# Patient Record
Sex: Male | Born: 1935 | Race: White | Hispanic: No | Marital: Married | Smoking: Never smoker
Health system: Southern US, Community
[De-identification: ages and names within clinical notes are randomized; demographics above are authoritative.]

## PROBLEM LIST (undated history)

## (undated) DIAGNOSIS — N200 Calculus of kidney: Secondary | ICD-10-CM

## (undated) DIAGNOSIS — E7800 Pure hypercholesterolemia, unspecified: Secondary | ICD-10-CM

## (undated) DIAGNOSIS — I10 Essential (primary) hypertension: Secondary | ICD-10-CM

---

## 1998-04-25 ENCOUNTER — Other Ambulatory Visit: Admission: RE | Admit: 1998-04-25 | Discharge: 1998-04-25 | Payer: Self-pay | Admitting: Urology

## 1998-08-21 ENCOUNTER — Ambulatory Visit (HOSPITAL_COMMUNITY): Admission: RE | Admit: 1998-08-21 | Discharge: 1998-08-21 | Payer: Self-pay | Admitting: Gastroenterology

## 1998-08-21 ENCOUNTER — Encounter (INDEPENDENT_AMBULATORY_CARE_PROVIDER_SITE_OTHER): Payer: Self-pay

## 1999-03-20 ENCOUNTER — Encounter: Admission: RE | Admit: 1999-03-20 | Discharge: 1999-03-20 | Payer: Self-pay | Admitting: Family Medicine

## 1999-03-20 ENCOUNTER — Encounter: Payer: Self-pay | Admitting: Family Medicine

## 2000-03-19 ENCOUNTER — Encounter: Payer: Self-pay | Admitting: Family Medicine

## 2000-03-19 ENCOUNTER — Encounter: Admission: RE | Admit: 2000-03-19 | Discharge: 2000-03-19 | Payer: Self-pay | Admitting: Family Medicine

## 2000-08-11 ENCOUNTER — Encounter: Admission: RE | Admit: 2000-08-11 | Discharge: 2000-08-11 | Payer: Self-pay | Admitting: Family Medicine

## 2000-08-11 ENCOUNTER — Encounter: Payer: Self-pay | Admitting: Family Medicine

## 2000-09-16 ENCOUNTER — Encounter: Payer: Self-pay | Admitting: Family Medicine

## 2000-09-16 ENCOUNTER — Encounter: Admission: RE | Admit: 2000-09-16 | Discharge: 2000-09-16 | Payer: Self-pay | Admitting: Family Medicine

## 2001-08-17 ENCOUNTER — Ambulatory Visit (HOSPITAL_COMMUNITY): Admission: RE | Admit: 2001-08-17 | Discharge: 2001-08-17 | Payer: Self-pay | Admitting: Gastroenterology

## 2004-04-06 ENCOUNTER — Ambulatory Visit: Payer: Self-pay | Admitting: Internal Medicine

## 2004-09-20 ENCOUNTER — Ambulatory Visit: Payer: Self-pay | Admitting: Internal Medicine

## 2004-09-27 ENCOUNTER — Ambulatory Visit (HOSPITAL_COMMUNITY): Admission: RE | Admit: 2004-09-27 | Discharge: 2004-09-27 | Payer: Self-pay | Admitting: Urology

## 2004-11-17 ENCOUNTER — Emergency Department (HOSPITAL_COMMUNITY): Admission: EM | Admit: 2004-11-17 | Discharge: 2004-11-17 | Payer: Self-pay | Admitting: Emergency Medicine

## 2004-12-08 ENCOUNTER — Emergency Department (HOSPITAL_COMMUNITY): Admission: EM | Admit: 2004-12-08 | Discharge: 2004-12-08 | Payer: Self-pay | Admitting: Emergency Medicine

## 2005-02-18 ENCOUNTER — Ambulatory Visit: Payer: Self-pay | Admitting: Internal Medicine

## 2008-03-14 ENCOUNTER — Encounter: Admission: RE | Admit: 2008-03-14 | Discharge: 2008-03-14 | Payer: Self-pay | Admitting: Family Medicine

## 2010-03-25 ENCOUNTER — Encounter: Payer: Self-pay | Admitting: Family Medicine

## 2010-07-20 NOTE — Consult Note (Signed)
NAME:  JOE, GEE NO.:  [id number]   MEDICAL RECORD NO.:  [id number]          PATIENT TYPE:  EMS   LOCATION:  ED                           FACILITY:  [facility]   PHYSICIAN:  Claudette Laws, M.D.  DATE OF BIRTH:  February 20, 1933   DATE OF CONSULTATION:  11/17/2004  DATE OF DISCHARGE:                                   CONSULTATION   CHIEF COMPLAINT:  Cannot urinate.   HISTORY OF PRESENT ILLNESS:  This 77 year old, married white male, a patient  of Dr. Boston Service, called me earlier this afternoon with the history  of urinary retention and suprapubic pain.  By history he does have a history  of an enlarged prostate and also a variable elevated PSA, status post  prostate biopsies.  The patient had a lithotripsy upon a renal stone back in  July.  He was playing golf today, came home and developed pain.  He came  down here from [facility] I saw him in the emergency room.   He was in acute distress with distended bladder, a circumcised male, normal  penis and scrotum, normal testicles.  We right ahead and injected 10 mL of  Xylocaine jelly and then put in a 16 French 10-cc Foley catheter and  immediately drained out about 700 mL of grossly clear urine, which we  submitted to the lab for a UA and a culture.  I went over the findings with  the patient, and we will send him home with a catheter to a leg drainage  bag, and then he will follow up with Dr. Wanda Plump the first part of next  week.  I put the patient on Flomax 0.4 mg, 1 a day number 30, and also I  started him on Cipro 250 mg 1 b.i.d. number 10 pending the urine culture.      Claudette Laws, M.D.  Electronically Signed     RFS/MEDQ  D:  11/17/2004  T:  11/17/2004  Job:  [id number]

## 2010-07-20 NOTE — Procedures (Signed)
[facility]  Patient:    TABB, CROGHAN Visit Number: [id number] MRN: [id number]          Service Type: END Location: ENDO Attending Physician:  Louie Bun Dictated by:   Everardo All Madilyn Fireman, M.D. Proc. Date: 08/17/01 Admit Date:  08/17/2001   CC:         Desma Maxim, M.D.   Procedure Report  PROCEDURE:  Colonoscopy.  INDICATION FOR PROCEDURE:  Adenomatous colon polyp three years ago.  DESCRIPTION OF PROCEDURE:  The patient was placed in the left lateral decubitus position and placed on the pulse monitor with continuous low-flow oxygen delivered by nasal cannula.  He was sedated with 50 mg of IV Demerol and 7 mg of IV Versed.  The Olympus video colonoscope was inserted into the rectum and advanced to the cecum, confirmed by transillumination at McBurneys point and visualization of the ileocecal valve and appendiceal orifice.  The prep was excellent.  The cecum, ascending, transverse, descending and sigmoid colon all appeared normal with no masses, polyps, diverticula, or other mucosal abnormalities.  The rectum likewise appeared normal, and retroflex view of the anus revealed only small internal hemorrhoids.  The colonoscope was then withdrawn, and the patient returned to the recovery room in stable condition.  He tolerated the procedure well, and there were no immediate complications.  IMPRESSION:  Normal colonoscopy.  PLAN:  Repeat colonoscopy in five years based on previous history of polyps. Dictated by:   Everardo All Madilyn Fireman, M.D. Attending Physician:  Louie Bun DD:  08/17/01 TD:  08/17/01 Job: [id number]

## 2015-04-12 DIAGNOSIS — H268 Other specified cataract: Secondary | ICD-10-CM | POA: Diagnosis not present

## 2015-04-12 DIAGNOSIS — H40002 Preglaucoma, unspecified, left eye: Secondary | ICD-10-CM | POA: Diagnosis not present

## 2015-04-12 DIAGNOSIS — H25812 Combined forms of age-related cataract, left eye: Secondary | ICD-10-CM | POA: Diagnosis not present

## 2015-04-12 DIAGNOSIS — Z961 Presence of intraocular lens: Secondary | ICD-10-CM | POA: Diagnosis not present

## 2015-06-01 DIAGNOSIS — N401 Enlarged prostate with lower urinary tract symptoms: Secondary | ICD-10-CM | POA: Diagnosis not present

## 2015-06-08 DIAGNOSIS — N401 Enlarged prostate with lower urinary tract symptoms: Secondary | ICD-10-CM | POA: Diagnosis not present

## 2015-06-08 DIAGNOSIS — N138 Other obstructive and reflux uropathy: Secondary | ICD-10-CM | POA: Diagnosis not present

## 2015-06-08 DIAGNOSIS — Z87442 Personal history of urinary calculi: Secondary | ICD-10-CM | POA: Diagnosis not present

## 2015-06-08 DIAGNOSIS — Z0000 Encounter for general adult medical examination without abnormal findings: Secondary | ICD-10-CM | POA: Diagnosis not present

## 2015-06-08 DIAGNOSIS — R351 Nocturia: Secondary | ICD-10-CM | POA: Diagnosis not present

## 2015-06-08 DIAGNOSIS — R972 Elevated prostate specific antigen [PSA]: Secondary | ICD-10-CM | POA: Diagnosis not present

## 2015-06-30 DIAGNOSIS — D1801 Hemangioma of skin and subcutaneous tissue: Secondary | ICD-10-CM | POA: Diagnosis not present

## 2015-06-30 DIAGNOSIS — L918 Other hypertrophic disorders of the skin: Secondary | ICD-10-CM | POA: Diagnosis not present

## 2015-06-30 DIAGNOSIS — D224 Melanocytic nevi of scalp and neck: Secondary | ICD-10-CM | POA: Diagnosis not present

## 2015-06-30 DIAGNOSIS — L814 Other melanin hyperpigmentation: Secondary | ICD-10-CM | POA: Diagnosis not present

## 2015-06-30 DIAGNOSIS — D485 Neoplasm of uncertain behavior of skin: Secondary | ICD-10-CM | POA: Diagnosis not present

## 2015-06-30 DIAGNOSIS — D225 Melanocytic nevi of trunk: Secondary | ICD-10-CM | POA: Diagnosis not present

## 2015-06-30 DIAGNOSIS — Z85828 Personal history of other malignant neoplasm of skin: Secondary | ICD-10-CM | POA: Diagnosis not present

## 2015-06-30 DIAGNOSIS — L821 Other seborrheic keratosis: Secondary | ICD-10-CM | POA: Diagnosis not present

## 2015-06-30 DIAGNOSIS — L570 Actinic keratosis: Secondary | ICD-10-CM | POA: Diagnosis not present

## 2015-07-07 DIAGNOSIS — R7303 Prediabetes: Secondary | ICD-10-CM | POA: Diagnosis not present

## 2015-07-07 DIAGNOSIS — Z6831 Body mass index (BMI) 31.0-31.9, adult: Secondary | ICD-10-CM | POA: Diagnosis not present

## 2015-07-07 DIAGNOSIS — N183 Chronic kidney disease, stage 3 (moderate): Secondary | ICD-10-CM | POA: Diagnosis not present

## 2015-07-07 DIAGNOSIS — E782 Mixed hyperlipidemia: Secondary | ICD-10-CM | POA: Diagnosis not present

## 2015-07-07 DIAGNOSIS — I129 Hypertensive chronic kidney disease with stage 1 through stage 4 chronic kidney disease, or unspecified chronic kidney disease: Secondary | ICD-10-CM | POA: Diagnosis not present

## 2015-07-07 DIAGNOSIS — E669 Obesity, unspecified: Secondary | ICD-10-CM | POA: Diagnosis not present

## 2015-07-07 DIAGNOSIS — R7309 Other abnormal glucose: Secondary | ICD-10-CM | POA: Diagnosis not present

## 2015-07-27 DIAGNOSIS — Z85828 Personal history of other malignant neoplasm of skin: Secondary | ICD-10-CM | POA: Diagnosis not present

## 2015-07-27 DIAGNOSIS — L570 Actinic keratosis: Secondary | ICD-10-CM | POA: Diagnosis not present

## 2015-08-22 ENCOUNTER — Encounter (HOSPITAL_COMMUNITY): Payer: Self-pay | Admitting: Emergency Medicine

## 2015-08-22 ENCOUNTER — Observation Stay (HOSPITAL_COMMUNITY)
Admission: EM | Admit: 2015-08-22 | Discharge: 2015-08-23 | Disposition: A | Discharge status: Home / Self Care | Payer: Medicare HMO | Attending: Family Medicine | Admitting: Family Medicine

## 2015-08-22 ENCOUNTER — Ambulatory Visit
Admission: RE | Admit: 2015-08-22 | Discharge: 2015-08-22 | Disposition: A | Discharge status: Home / Self Care | Payer: Medicare HMO | Source: Ambulatory Visit | Attending: Family Medicine | Admitting: Family Medicine

## 2015-08-22 ENCOUNTER — Other Ambulatory Visit: Payer: Self-pay | Admitting: Family Medicine

## 2015-08-22 DIAGNOSIS — E785 Hyperlipidemia, unspecified: Secondary | ICD-10-CM | POA: Diagnosis not present

## 2015-08-22 DIAGNOSIS — I129 Hypertensive chronic kidney disease with stage 1 through stage 4 chronic kidney disease, or unspecified chronic kidney disease: Secondary | ICD-10-CM | POA: Insufficient documentation

## 2015-08-22 DIAGNOSIS — K862 Cyst of pancreas: Secondary | ICD-10-CM | POA: Diagnosis not present

## 2015-08-22 DIAGNOSIS — D649 Anemia, unspecified: Secondary | ICD-10-CM | POA: Diagnosis not present

## 2015-08-22 DIAGNOSIS — K8689 Other specified diseases of pancreas: Secondary | ICD-10-CM | POA: Diagnosis present

## 2015-08-22 DIAGNOSIS — E7800 Pure hypercholesterolemia, unspecified: Secondary | ICD-10-CM | POA: Diagnosis not present

## 2015-08-22 DIAGNOSIS — N400 Enlarged prostate without lower urinary tract symptoms: Secondary | ICD-10-CM | POA: Insufficient documentation

## 2015-08-22 DIAGNOSIS — I10 Essential (primary) hypertension: Secondary | ICD-10-CM | POA: Diagnosis present

## 2015-08-22 DIAGNOSIS — R0602 Shortness of breath: Secondary | ICD-10-CM

## 2015-08-22 DIAGNOSIS — N183 Chronic kidney disease, stage 3 unspecified: Secondary | ICD-10-CM | POA: Diagnosis present

## 2015-08-22 DIAGNOSIS — Z7982 Long term (current) use of aspirin: Secondary | ICD-10-CM | POA: Insufficient documentation

## 2015-08-22 DIAGNOSIS — Z79899 Other long term (current) drug therapy: Secondary | ICD-10-CM | POA: Diagnosis not present

## 2015-08-22 DIAGNOSIS — D509 Iron deficiency anemia, unspecified: Secondary | ICD-10-CM | POA: Diagnosis present

## 2015-08-22 DIAGNOSIS — R5383 Other fatigue: Secondary | ICD-10-CM | POA: Diagnosis not present

## 2015-08-22 HISTORY — DX: Essential (primary) hypertension: I10

## 2015-08-22 HISTORY — DX: Calculus of kidney: N20.0

## 2015-08-22 HISTORY — DX: Pure hypercholesterolemia, unspecified: E78.00

## 2015-08-22 LAB — COMPREHENSIVE METABOLIC PANEL
ALBUMIN: 3.2 g/dL — AB (ref 3.5–5.0)
ALK PHOS: 44 U/L (ref 38–126)
ALT: 17 U/L (ref 17–63)
ANION GAP: 7 (ref 5–15)
AST: 23 U/L (ref 15–41)
BILIRUBIN TOTAL: 0.4 mg/dL (ref 0.3–1.2)
BUN: 15 mg/dL (ref 6–20)
CALCIUM: 8.8 mg/dL — AB (ref 8.9–10.3)
CO2: 23 mmol/L (ref 22–32)
Chloride: 109 mmol/L (ref 101–111)
Creatinine, Ser: 1.57 mg/dL — ABNORMAL HIGH (ref 0.61–1.24)
GFR calc Af Amer: 46 mL/min — ABNORMAL LOW (ref >60)
GFR calc non Af Amer: 40 mL/min — ABNORMAL LOW (ref >60)
GLUCOSE: 112 mg/dL — AB (ref 65–99)
POTASSIUM: 3.7 mmol/L (ref 3.5–5.1)
SODIUM: 139 mmol/L (ref 135–145)
TOTAL PROTEIN: 6.1 g/dL — AB (ref 6.5–8.1)

## 2015-08-22 LAB — CBC
HEMATOCRIT: 23.3 % — AB (ref 39.0–52.0)
HEMOGLOBIN: 6.4 g/dL — AB (ref 13.0–17.0)
MCH: 20.5 pg — AB (ref 26.0–34.0)
MCHC: 27.5 g/dL — AB (ref 30.0–36.0)
MCV: 74.7 fL — ABNORMAL LOW (ref 78.0–100.0)
Platelets: 182 10*3/uL (ref 150–400)
RBC: 3.12 MIL/uL — ABNORMAL LOW (ref 4.22–5.81)
RDW: 18.7 % — AB (ref 11.5–15.5)
WBC: 6.7 10*3/uL (ref 4.0–10.5)

## 2015-08-22 LAB — POC OCCULT BLOOD, ED: FECAL OCCULT BLD: NEGATIVE

## 2015-08-22 LAB — PREPARE RBC (CROSSMATCH)

## 2015-08-22 LAB — ABO/RH: ABO/RH(D): A NEG

## 2015-08-22 MED ORDER — SODIUM CHLORIDE 0.9 % IV SOLN: Freq: Once | INTRAVENOUS | Status: DC

## 2015-08-22 MED ORDER — IOPAMIDOL (ISOVUE-370) INJECTION 76%
80.0000 mL | Freq: Once | INTRAVENOUS | Status: AC | PRN
  Administered 2015-08-22: 80 mL via INTRAVENOUS

## 2015-08-22 NOTE — ED Notes (Signed)
Attempted to call report x2

## 2015-08-22 NOTE — ED Notes (Signed)
Hemoglobin 6.4 critical lab notification.

## 2015-08-22 NOTE — ED Notes (Signed)
Attempted to call report x 1  

## 2015-08-22 NOTE — ED Provider Notes (Signed)
CSN: [id number]     Arrival date & time 08/22/15  1933 History   First MD Initiated Contact with Patient 08/22/15 2126     Chief Complaint  Patient presents with  . Fatigue  . Abnormal Lab     (Consider location/radiation/quality/duration/timing/severity/associated sxs/prior Treatment) HPI Comments: Patient with history of stage III CKD, non-cancerous polyps identified on colonoscopy in 2010, normal colonoscopy in 2013, prostatic hypertrophy -- presents with approximately one month worth of fatigue and shortness of breath with exertion. Patient was seen by PCP today. CT angiography was done which showed enlargement of a cystic pancreatic mass. This has been previously identified. Hemoglobin was low and patient was referred to the emergency department. Patient is asymptomatic at rest. He denies bleeding in his stool, gums, urine. No abdominal pain or epigastric pain. No history of peptic ulcer disease. Patient denies heavy NSAID use but does take a baby aspirin daily. He does not drink alcohol. The onset of this condition was gradual. The course is constant. Aggravating factors: exertion. Alleviating factors: none.    The history is provided by the patient and a relative.    Past Medical History  Diagnosis Date  . Kidney stones   . Hypertension   . High cholesterol    History reviewed. No pertinent past surgical history. No family history on file. Social History  Substance Use Topics  . Smoking status: Never Smoker   . Smokeless tobacco: None  . Alcohol Use: No    Review of Systems  Constitutional: Positive for fatigue. Negative for fever.  HENT: Negative for rhinorrhea and sore throat.   Eyes: Negative for redness.  Respiratory: Negative for cough.   Cardiovascular: Negative for chest pain.  Gastrointestinal: Negative for nausea, vomiting, abdominal pain and diarrhea.  Genitourinary: Negative for dysuria.  Musculoskeletal: Negative for myalgias.  Skin: Negative for rash.   Neurological: Positive for weakness. Negative for headaches.    Allergies  Review of patient's allergies indicates no known allergies.  Home Medications   Prior to Admission medications   Medication Sig Start Date End Date Taking? Authorizing Provider  aspirin EC 81 MG tablet Take 81 mg by mouth daily.   Yes Historical Provider, MD  doxazosin (CARDURA) 4 MG tablet Take 2 mg by mouth 2 (two) times daily.   Yes Historical Provider, MD  finasteride (PROSCAR) 5 MG tablet Take 5 mg by mouth daily.   Yes Historical Provider, MD  lisinopril-hydrochlorothiazide (PRINZIDE,ZESTORETIC) 20-25 MG tablet Take 1 tablet by mouth daily.   Yes Historical Provider, MD  Multiple Vitamin (MULTIVITAMIN WITH MINERALS) TABS tablet Take 1 tablet by mouth daily.   Yes Historical Provider, MD  Omega-3 Fatty Acids (FISH OIL) 1000 MG CAPS Take 1,000 mg by mouth daily.   Yes Historical Provider, MD  omeprazole (PRILOSEC) 40 MG capsule Take 40 mg by mouth daily.   Yes Historical Provider, MD  potassium chloride SA (K-DUR,KLOR-CON) 20 MEQ tablet Take 20 mEq by mouth daily.   Yes Historical Provider, MD  simvastatin (ZOCOR) 20 MG tablet Take 20 mg by mouth daily.   Yes Historical Provider, MD   BP 160/63 mmHg  Pulse 70  Temp(Src) 98.7 F (37.1 C) (Oral)  Resp 16  SpO2 96%   Physical Exam  Constitutional: He appears well-developed and well-nourished.  HENT:  Head: Normocephalic and atraumatic.  Mouth/Throat: Oropharynx is clear and moist.  Eyes: Right eye exhibits no discharge. Left eye exhibits no discharge.  Conjunctiva are pale.  Neck: Normal range of motion. Neck  supple.  Cardiovascular: Normal rate, regular rhythm and normal heart sounds.   No murmur heard. Pulmonary/Chest: Effort normal and breath sounds normal. No respiratory distress. He has no wheezes. He has no rales.  Abdominal: Soft. There is no tenderness.  Genitourinary: Rectal exam shows no external hemorrhoid, no internal hemorrhoid, no mass,  no tenderness and anal tone normal. Guaiac negative stool. Prostate is enlarged.  Neurological: He is alert.  Skin: Skin is warm and dry. There is pallor.  Psychiatric: He has a normal mood and affect.  Nursing note and vitals reviewed.   ED Course  Procedures (including critical care time) Labs Review Labs Reviewed  COMPREHENSIVE METABOLIC PANEL - Abnormal; Notable for the following:    Glucose, Bld 112 (*)    Creatinine, Ser 1.57 (*)    Calcium 8.8 (*)    Total Protein 6.1 (*)    Albumin 3.2 (*)    GFR calc non Af Amer 40 (*)    GFR calc Af Amer 46 (*)    All other components within normal limits  CBC - Abnormal; Notable for the following:    RBC 3.12 (*)    Hemoglobin 6.4 (*)    HCT 23.3 (*)    MCV 74.7 (*)    MCH 20.5 (*)    MCHC 27.5 (*)    RDW 18.7 (*)    All other components within normal limits  VITAMIN B12  FOLATE  IRON AND TIBC  FERRITIN  RETICULOCYTES  POC OCCULT BLOOD, ED  TYPE AND SCREEN  ABO/RH  PREPARE RBC (CROSSMATCH)    Imaging Review Ct Angio Chest Pe W Or Wo Contrast  08/22/2015  CLINICAL DATA:  Shortness of breath for 1 month. EXAM: CT ANGIOGRAPHY CHEST WITH CONTRAST TECHNIQUE: Multidetector CT imaging of the chest was performed using the standard protocol during bolus administration of intravenous contrast. Multiplanar CT image reconstructions and MIPs were obtained to evaluate the vascular anatomy. CONTRAST:  80 mL Isovue 370 COMPARISON:  Noncontrast chest CT on 02/21/2005 and CAP CT on 08/22/2004 FINDINGS: Mediastinum/Lymph Nodes: No pulmonary emboli or thoracic aortic dissection identified. Aortic atherosclerosis noted. No evidence of thoracic aortic aneurysm. Normal heart size. No masses or pathologically enlarged lymph nodes identified. Lungs/Pleura: No pulmonary mass, infiltrate, or effusion. Mild centrilobular emphysema again noted. Mild right lower lobe scarring remains stable. A few scattered sub-cm pulmonary nodules in the left upper and lower  lobes remain stable since 2006, consistent with benign etiology. Upper abdomen: A complex cystic lesion is seen in the pancreatic neck which measures 3.9 x 2.8 cm on image 105/series 4. This has increased in size from 1.1 x 3.0 cm on previous study in 2006. This is suspicious for a cystic pancreatic neoplasm. Probable small bilateral renal cysts also noted. Musculoskeletal: No chest wall mass or suspicious bone lesions identified. Review of the MIP images confirms the above findings. IMPRESSION: No evidence of pulmonary embolism or other acute findings within the thorax. Mild emphysema. Aortic atherosclerosis noted. Increased size of complex cystic lesion in the pancreatic neck, suspicious for cystic pancreatic neoplasm. Abdomen MRI and MRCP without and with contrast is recommended for further characterization. Electronically Signed   By: Earle Gell M.D.   On: 08/22/2015 13:41   I have personally reviewed and evaluated these images and lab results as part of my medical decision-making.   10:26 PM Patient seen and examined. Rectal exam performed with RN chaperone. Will need admission for evaluation of anemia.   Vital signs reviewed and are  as follows: BP 160/63 mmHg  Pulse 70  Temp(Src) 98.7 F (37.1 C) (Oral)  Resp 16  SpO2 96%  11:03 PM Discussed with Dr. Jeanell Sparrow.  Admit to medicine, Dr. Myna Hidalgo accepts patient.   MDM   Final diagnoses:  Symptomatic anemia   Admit.    Carlisle Cater, PA-C 08/22/15 [id number]  Pattricia Boss, MD 08/25/15 [phone]

## 2015-08-22 NOTE — ED Notes (Signed)
Pt states he had been feeling tired for "a while" went to go see PCP had lab work done, EKG, and CT angio done to check for PE. Pt called back and said hemoglobin was 7 and needed a blood transfusion. Pt denies any symptoms at this time, ambulatory, AAOX4. Denies pain. Respirations e/u

## 2015-08-22 NOTE — ED Notes (Signed)
Dr. Pattricia Boss and nurse first notified of pt Hemoglobin. Will move patient back to room shortly.

## 2015-08-23 ENCOUNTER — Encounter (HOSPITAL_COMMUNITY): Payer: Self-pay | Admitting: Family Medicine

## 2015-08-23 ENCOUNTER — Other Ambulatory Visit: Payer: Self-pay | Admitting: Family Medicine

## 2015-08-23 DIAGNOSIS — N183 Chronic kidney disease, stage 3 unspecified: Secondary | ICD-10-CM | POA: Diagnosis present

## 2015-08-23 DIAGNOSIS — K862 Cyst of pancreas: Secondary | ICD-10-CM

## 2015-08-23 DIAGNOSIS — E785 Hyperlipidemia, unspecified: Secondary | ICD-10-CM | POA: Diagnosis not present

## 2015-08-23 DIAGNOSIS — I10 Essential (primary) hypertension: Secondary | ICD-10-CM

## 2015-08-23 DIAGNOSIS — K8689 Other specified diseases of pancreas: Secondary | ICD-10-CM | POA: Diagnosis present

## 2015-08-23 DIAGNOSIS — D649 Anemia, unspecified: Secondary | ICD-10-CM | POA: Diagnosis not present

## 2015-08-23 DIAGNOSIS — K869 Disease of pancreas, unspecified: Secondary | ICD-10-CM

## 2015-08-23 DIAGNOSIS — D509 Iron deficiency anemia, unspecified: Secondary | ICD-10-CM

## 2015-08-23 LAB — IRON AND TIBC
Iron: 14 ug/dL — ABNORMAL LOW (ref 45–182)
Saturation Ratios: 3 % — ABNORMAL LOW (ref 17.9–39.5)
TIBC: 463 ug/dL — AB (ref 250–450)
UIBC: 449 ug/dL

## 2015-08-23 LAB — BASIC METABOLIC PANEL
Anion gap: 7 (ref 5–15)
BUN: 13 mg/dL (ref 6–20)
CALCIUM: 8.6 mg/dL — AB (ref 8.9–10.3)
CO2: 25 mmol/L (ref 22–32)
Chloride: 108 mmol/L (ref 101–111)
Creatinine, Ser: 1.34 mg/dL — ABNORMAL HIGH (ref 0.61–1.24)
GFR calc Af Amer: 56 mL/min — ABNORMAL LOW (ref >60)
GFR, EST NON AFRICAN AMERICAN: 48 mL/min — AB (ref >60)
GLUCOSE: 103 mg/dL — AB (ref 65–99)
Potassium: 3.9 mmol/L (ref 3.5–5.1)
SODIUM: 140 mmol/L (ref 135–145)

## 2015-08-23 LAB — HEMOGLOBIN AND HEMATOCRIT, BLOOD
HEMATOCRIT: 28.3 % — AB (ref 39.0–52.0)
Hemoglobin: 8.2 g/dL — ABNORMAL LOW (ref 13.0–17.0)

## 2015-08-23 LAB — RETICULOCYTES
RBC.: 3.15 MIL/uL — ABNORMAL LOW (ref 4.22–5.81)
Retic Count, Absolute: 81.9 10*3/uL (ref 19.0–186.0)
Retic Ct Pct: 2.6 % (ref 0.4–3.1)

## 2015-08-23 LAB — VITAMIN B12: VITAMIN B 12: 494 pg/mL (ref 180–914)

## 2015-08-23 LAB — GLUCOSE, CAPILLARY: Glucose-Capillary: 102 mg/dL — ABNORMAL HIGH (ref 65–99)

## 2015-08-23 LAB — FERRITIN: Ferritin: 5 ng/mL — ABNORMAL LOW (ref 24–336)

## 2015-08-23 LAB — FOLATE: Folate: 34.3 ng/mL (ref >5.9)

## 2015-08-23 LAB — FIBRINOGEN: Fibrinogen: 348 mg/dL (ref 204–475)

## 2015-08-23 LAB — LACTATE DEHYDROGENASE: LDH: 198 U/L — AB (ref 98–192)

## 2015-08-23 MED ORDER — SODIUM CHLORIDE 0.9% FLUSH: 3.0000 mL | Freq: Two times a day (BID) | INTRAVENOUS | Status: DC

## 2015-08-23 MED ORDER — LISINOPRIL-HYDROCHLOROTHIAZIDE 20-25 MG PO TABS: 1.0000 | ORAL_TABLET | Freq: Every day | ORAL | Status: DC

## 2015-08-23 MED ORDER — HYDROCHLOROTHIAZIDE 25 MG PO TABS
25.0000 mg | ORAL_TABLET | Freq: Every day | ORAL | Status: DC
  Filled 2015-08-23: qty 1

## 2015-08-23 MED ORDER — ACETAMINOPHEN 650 MG RE SUPP: 650.0000 mg | Freq: Four times a day (QID) | RECTAL | Status: DC | PRN

## 2015-08-23 MED ORDER — DOXAZOSIN MESYLATE 2 MG PO TABS
2.0000 mg | ORAL_TABLET | Freq: Two times a day (BID) | ORAL | Status: DC
  Administered 2015-08-23: 2 mg via ORAL
  Filled 2015-08-23 (×2): qty 1

## 2015-08-23 MED ORDER — LISINOPRIL 20 MG PO TABS
20.0000 mg | ORAL_TABLET | Freq: Every day | ORAL | Status: DC
  Filled 2015-08-23: qty 1

## 2015-08-23 MED ORDER — SODIUM CHLORIDE 0.9% FLUSH: 3.0000 mL | INTRAVENOUS | Status: DC | PRN

## 2015-08-23 MED ORDER — DOCUSATE SODIUM 100 MG PO CAPS: 100.0000 mg | ORAL_CAPSULE | Freq: Two times a day (BID) | ORAL | Status: AC

## 2015-08-23 MED ORDER — SODIUM CHLORIDE 0.9 % IV SOLN
Freq: Once | INTRAVENOUS | Status: AC
Start: 2015-08-23 — End: 2015-08-23

## 2015-08-23 MED ORDER — OMEGA-3-ACID ETHYL ESTERS 1 G PO CAPS
1.0000 g | ORAL_CAPSULE | Freq: Every day | ORAL | Status: DC
  Filled 2015-08-23: qty 1

## 2015-08-23 MED ORDER — ADULT MULTIVITAMIN W/MINERALS CH
1.0000 | ORAL_TABLET | Freq: Every day | ORAL | Status: DC
  Filled 2015-08-23: qty 1

## 2015-08-23 MED ORDER — ACETAMINOPHEN 325 MG PO TABS: 650.0000 mg | ORAL_TABLET | Freq: Four times a day (QID) | ORAL | Status: DC | PRN

## 2015-08-23 MED ORDER — ONDANSETRON HCL 4 MG/2ML IJ SOLN: 4.0000 mg | Freq: Four times a day (QID) | INTRAMUSCULAR | Status: DC | PRN

## 2015-08-23 MED ORDER — SODIUM CHLORIDE 0.9% FLUSH
3.0000 mL | Freq: Two times a day (BID) | INTRAVENOUS | Status: DC
  Administered 2015-08-23 (×2): 3 mL via INTRAVENOUS

## 2015-08-23 MED ORDER — ONDANSETRON HCL 4 MG PO TABS: 4.0000 mg | ORAL_TABLET | Freq: Four times a day (QID) | ORAL | Status: DC | PRN

## 2015-08-23 MED ORDER — FINASTERIDE 5 MG PO TABS
5.0000 mg | ORAL_TABLET | Freq: Every day | ORAL | Status: DC
  Filled 2015-08-23: qty 1

## 2015-08-23 MED ORDER — SIMVASTATIN 20 MG PO TABS
20.0000 mg | ORAL_TABLET | Freq: Every day | ORAL | Status: DC
  Filled 2015-08-23: qty 1

## 2015-08-23 MED ORDER — HYDROCODONE-ACETAMINOPHEN 5-325 MG PO TABS: 1.0000 | ORAL_TABLET | ORAL | Status: DC | PRN

## 2015-08-23 MED ORDER — POTASSIUM CHLORIDE CRYS ER 20 MEQ PO TBCR
20.0000 meq | EXTENDED_RELEASE_TABLET | Freq: Every day | ORAL | Status: DC
  Filled 2015-08-23: qty 1

## 2015-08-23 MED ORDER — FERROUS SULFATE 325 (65 FE) MG PO TABS: 325.0000 mg | ORAL_TABLET | Freq: Every day | ORAL | Status: AC

## 2015-08-23 MED ORDER — SODIUM CHLORIDE 0.9 % IV SOLN: 250.0000 mL | INTRAVENOUS | Status: DC | PRN

## 2015-08-23 MED ORDER — PANTOPRAZOLE SODIUM 40 MG PO TBEC
40.0000 mg | DELAYED_RELEASE_TABLET | Freq: Every day | ORAL | Status: DC
  Filled 2015-08-23: qty 1

## 2015-08-23 NOTE — Progress Notes (Signed)
08/23/2015 12:25 PM  Progress Note  Pt was seen and examined.  Pt tolerated transfusion well.  Reviewed labs.  Spoke with family at bedside.   Murvin Natal, MD

## 2015-08-23 NOTE — Care Management Obs Status (Signed)
MEDICARE OBSERVATION STATUS NOTIFICATION   Patient Details  Name: Frederick Reed MRN: [id number] Date of Birth: 05/26/1932   Medicare Observation Status Notification Given:  Yes    Sharin Mons, RN 08/23/2015, 9:41 AM

## 2015-08-23 NOTE — Discharge Summary (Signed)
Physician Discharge Summary  Frederick Reed [id number] DOB: 1933-01-04 DOA: 08/22/2015  PCP: Mayra Neer, MD  Admit date: 08/22/2015 Discharge date: 08/23/2015  Admitted From: Home Disposition:  Home  Recommendations for Outpatient Follow-up:  1. Follow up with PCP as scheduled in 5 days for repeat testing.  2. Please get MRI of abdomen scheduled and arrange ERCP 3. Please see gastroenterologist for EGD and colonoscopy testing.    Discharge Condition: Stable  CODE STATUS: FULL Diet recommendation: Heart Healthy with iron rich foods  Brief/Interim Summary: Frederick Reed is a 82 y.o. male with medical history significant for hypertension, hyperlipidemia, and chronic kidney disease stage III who presents to the ED for evaluation of easy fatigability and low hemoglobin on outpatient labs. Patient reports that he has become easily fatigued over the past month, noting that he now has to rest several times during yard work that he had recently performed easily without stopping to rest. Over this interval, patient has not noted any melena or hematochezia and denies abdominal pain or nausea. He takes Prilosec daily as well as an 81 mg aspirin, but denies use of any other NSAID.he has had colonoscopy in the past with polypectomy, pathology returning benign. He denies any prior history of anemia. He was evaluated by his PCP in the clinic today for his fatigue and outpatient blood work and imaging was obtained. There was some concern that the patient may have a pulmonary embolism and CTA was performed today in the outpatient setting and negative for PE, but but incidentally notable for the enlargement of a complex cyst at the pancreatic neck that had been identified on a CT from 2006, but is now significantly larger. Blood work was notable for a hemoglobin of 7 and the patient was advised to seek further evaluation and management of this in the emergency department. He denies any history of anemia  prior to this.  ED Course: Upon arrival to the ED, patient is found to be afebrile, saturating well on room air, and with vital signs stable. CMP features a serum creatinine of 1.57 and CBC is notable for a hemoglobin of 6.4 with MCV of 74.7. DRE was met with brown stool which was FOBT negative. 2 units of packed red blood cells were ordered for immediate transfusion. Patient has remained hemodynamically stable in the ED and will be observed on the telemetry unit for ongoing evaluation and management of symptomatic anemia.  1. Symptomatic anemia, microcytic  - Pt denies prior hx of anemia; had been easily fatigued for 1 month, prompting PCP to check CBC  - Hgb reportedly 7 with PCP; initial Hgb 6.4 here  - FOBT negative and pt denies melena or hematochezia, not jaundiced and denies dark urine - Check hemolysis labs, iron studies, B12, folate, reticulocytes  - 2 units ordered for immediate transfusion  - post-transfusion H&H 8.2/28.3 - Pt to follow up with PCP for repeat testing in 5 days.   2. Complex cyst, pancreas  - Pt had CTA PE study on 6/20, ordered by PCP for eval of his fatigue - CTA demonstrates interval growth in complex cyst at pancreatic neck since prior study in 2006  - Pt denies wt loss, sweats, fevers, abd discomfort or early satiety  - MRI and MRCP without contrast are advised for further evaluation, can likely be done outpatientwith PCP or gastroenterologist.   3. CKD stage III  - Pt reports history of CKD stage III but baseline BUN and SCr unknown  - SCr 1.57 on  admission  - Avoid nephrotoxins, trend renal fxn   4. Hypertension  - Slightly elevated in ED  - Managed with lisinopril and HCTZ at home, will continue   5. Hyperlipidemia  - Continue current management with Zocor and Lovaza   6. BPH  - Stable  - Continue current management with Cardura and Proscar   Discharge Diagnoses:  Principal Problem:   Symptomatic anemia Active Problems:    Pancreatic mass   CKD (chronic kidney disease), stage III   Hypertension   Hyperlipidemia   Anemia, iron deficiency  Discharge Instructions  Discharge Instructions    Increase activity slowly    Complete by:  As directed             Medication List    TAKE these medications        aspirin EC 81 MG tablet  Take 81 mg by mouth daily.     docusate sodium 100 MG capsule  Commonly known as:  COLACE  Take 1 capsule (100 mg total) by mouth 2 (two) times daily.     doxazosin 4 MG tablet  Commonly known as:  CARDURA  Take 2 mg by mouth 2 (two) times daily.     ferrous sulfate 325 (65 FE) MG tablet  Take 1 tablet (325 mg total) by mouth daily with breakfast.     finasteride 5 MG tablet  Commonly known as:  PROSCAR  Take 5 mg by mouth daily.     Fish Oil 1000 MG Caps  Take 1,000 mg by mouth daily.     lisinopril-hydrochlorothiazide 20-25 MG tablet  Commonly known as:  PRINZIDE,ZESTORETIC  Take 1 tablet by mouth daily.     multivitamin with minerals Tabs tablet  Take 1 tablet by mouth daily.     omeprazole 40 MG capsule  Commonly known as:  PRILOSEC  Take 40 mg by mouth daily.     potassium chloride SA 20 MEQ tablet  Commonly known as:  K-DUR,KLOR-CON  Take 20 mEq by mouth daily.     simvastatin 20 MG tablet  Commonly known as:  ZOCOR  Take 20 mg by mouth daily.           Follow-up Information    Follow up with SHAW,KIMBERLEE, MD In 5 days.   Specialty:  Family Medicine   Why:  as scheduled for hospital follow up    Contact information:   [address] [place] [address] [phone]      No Known Allergies  Procedures/Studies: Ct Angio Chest Pe W Or Wo Contrast  08/22/2015  CLINICAL DATA:  Shortness of breath for 1 month. EXAM: CT ANGIOGRAPHY CHEST WITH CONTRAST TECHNIQUE: Multidetector CT imaging of the chest was performed using the standard protocol during bolus administration of intravenous contrast. Multiplanar CT image  reconstructions and MIPs were obtained to evaluate the vascular anatomy. CONTRAST:  80 mL Isovue 370 COMPARISON:  Noncontrast chest CT on 02/21/2005 and CAP CT on 08/22/2004 FINDINGS: Mediastinum/Lymph Nodes: No pulmonary emboli or thoracic aortic dissection identified. Aortic atherosclerosis noted. No evidence of thoracic aortic aneurysm. Normal heart size. No masses or pathologically enlarged lymph nodes identified. Lungs/Pleura: No pulmonary mass, infiltrate, or effusion. Mild centrilobular emphysema again noted. Mild right lower lobe scarring remains stable. A few scattered sub-cm pulmonary nodules in the left upper and lower lobes remain stable since 2006, consistent with benign etiology. Upper abdomen: A complex cystic lesion is seen in the pancreatic neck which measures 3.9 x 2.8 cm  on image 105/series 4. This has increased in size from 1.1 x 3.0 cm on previous study in 2006. This is suspicious for a cystic pancreatic neoplasm. Probable small bilateral renal cysts also noted. Musculoskeletal: No chest wall mass or suspicious bone lesions identified. Review of the MIP images confirms the above findings. IMPRESSION: No evidence of pulmonary embolism or other acute findings within the thorax. Mild emphysema. Aortic atherosclerosis noted. Increased size of complex cystic lesion in the pancreatic neck, suspicious for cystic pancreatic neoplasm. Abdomen MRI and MRCP without and with contrast is recommended for further characterization. Electronically Signed   By: Earle Gell M.D.   On: 08/22/2015 13:41     Subjective: Pt says he wants to go home.  He has good outpatient follow up.    Discharge Exam: Filed Vitals:   08/23/15 0426 08/23/15 0615  BP: 146/62 152/81  Pulse: 61 56  Temp: 97.9 F (36.6 C) 97.9 F (36.6 C)  Resp: 16 16   Filed Vitals:   08/23/15 0313 08/23/15 0405 08/23/15 0426 08/23/15 0615  BP: 160/67 155/73 146/62 152/81  Pulse: 61 67 61 56  Temp: 98.1 F (36.7 C) 98.4 F (36.9 C)  97.9 F (36.6 C) 97.9 F (36.6 C)  TempSrc:  Oral Oral Oral  Resp: _0 Height:      Weight:      SpO2: 99% 100% 97% 99%    General: Pt is alert, awake, not in acute distress Cardiovascular: RRR, S1/S2 +, no rubs, no gallops Respiratory: CTA bilaterally, no wheezing, no rhonchi Abdominal: Soft, NT, ND, bowel sounds + Extremities: no edema, no cyanosis    The results of significant diagnostics from this hospitalization (including imaging, microbiology, ancillary and laboratory) are listed below for reference.     Microbiology: No results found for this or any previous visit (from the past 240 hour(s)).   Labs: BNP (last 3 results) No results for input(s): BNP in the last 8760 hours. Basic Metabolic Panel:  Recent Labs Lab 08/22/15 2008 08/23/15 0808  NA 139 140  K 3.7 3.9  CL 109 108  CO2 23 25  GLUCOSE 112* 103*  BUN 15 13  CREATININE 1.57* 1.34*  CALCIUM 8.8* 8.6*   Liver Function Tests:  Recent Labs Lab 08/22/15 2008  AST 23  ALT 17  ALKPHOS 44  BILITOT 0.4  PROT 6.1*  ALBUMIN 3.2*   No results for input(s): LIPASE, AMYLASE in the last 168 hours. No results for input(s): AMMONIA in the last 168 hours. CBC:  Recent Labs Lab 08/22/15 2008 08/23/15 0947  WBC 6.7  --   HGB 6.4* 8.2*  HCT 23.3* 28.3*  MCV 74.7*  --   PLT 182  --    Cardiac Enzymes: No results for input(s): CKTOTAL, CKMB, CKMBINDEX, TROPONINI in the last 168 hours. BNP: Invalid input(s): POCBNP CBG:  Recent Labs Lab 08/23/15 0808  GLUCAP 102*   D-Dimer No results for input(s): DDIMER in the last 72 hours. Hgb A1c No results for input(s): HGBA1C in the last 72 hours. Lipid Profile No results for input(s): CHOL, HDL, LDLCALC, TRIG, CHOLHDL, LDLDIRECT in the last 72 hours. Thyroid function studies No results for input(s): TSH, T4TOTAL, T3FREE, THYROIDAB in the last 72 hours.  Invalid input(s): FREET3 Anemia work up  Recent Labs  08/22/15 2339 08/22/15 2340   VITAMINB12 494  --   FOLATE  --  34.3  FERRITIN 5*  --   TIBC 463*  --   IRON  14*  --   RETICCTPCT 2.6  --    Urinalysis No results found for: COLORURINE, APPEARANCEUR, LABSPEC, Hermann, GLUCOSEU, HGBUR, BILIRUBINUR, KETONESUR, PROTEINUR, UROBILINOGEN, NITRITE, LEUKOCYTESUR Sepsis Labs Invalid input(s): PROCALCITONIN,  WBC,  LACTICIDVEN Microbiology No results found for this or any previous visit (from the past 240 hour(s)).   Time coordinating discharge: 28 minutes  SIGNED:   Irwin Brakeman, MD  [facility] 08/23/2015, 1:10 PM Pager   If 7PM-7AM, please contact night-coverage [email or web address] Password [email or web address]

## 2015-08-23 NOTE — H&P (Signed)
History and Physical    Frederick Reed LFY:[id number] DOB: 1932-06-22 DOA: 08/22/2015  PCP: Mayra Neer, MD   Patient coming from: Home   Chief Complaint: Fatigue, low Hgb   HPI: Frederick Reed is a 82 y.o. male with medical history significant for hypertension, hyperlipidemia, and chronic kidney disease stage III who presents to the ED for evaluation of easy fatigability and low hemoglobin on outpatient labs. Patient reports that he has become easily fatigued over the past month, noting that he now has to rest several times during yard work that he had recently performed easily without stopping to rest. Over this interval, patient has not noted any melena or hematochezia and denies abdominal pain or nausea. He takes Prilosec daily as well as an 81 mg aspirin, but denies use of any other NSAID.he has had colonoscopy in the past with polypectomy, pathology returning benign. He denies any prior history of anemia. He was evaluated by his PCP in the clinic today for his fatigue and outpatient blood work and imaging was obtained. There was some concern that the patient may have a pulmonary embolism and CTA was performed today in the outpatient setting and negative for PE, but but incidentally notable for the enlargement of a complex cyst at the pancreatic neck that had been identified on a CT from 2006, but is now significantly larger. Blood work was notable for a hemoglobin of 7 and the patient was advised to seek further evaluation and management of this in the emergency department. He denies any history of anemia prior to this.  ED Course: Upon arrival to the ED, patient is found to be afebrile, saturating well on room air, and with vital signs stable. CMP features a serum creatinine of 1.57 and CBC is notable for a hemoglobin of 6.4 with MCV of 74.7. DRE was met with brown stool which was FOBT negative. 2 units of packed red blood cells were ordered for immediate transfusion. Patient has remained  hemodynamically stable in the ED and will be observed on the telemetry unit for ongoing evaluation and management of symptomatic anemia.  Review of Systems:  All other systems reviewed and apart from HPI, are negative.  Past Medical History  Diagnosis Date  . Kidney stones   . Hypertension   . High cholesterol     History reviewed. No pertinent past surgical history.   reports that he has never smoked. He does not have any smokeless tobacco history on file. He reports that he does not drink alcohol. His drug history is not on file.  No Known Allergies  History reviewed. No pertinent family history.   Prior to Admission medications   Medication Sig Start Date End Date Taking? Authorizing Provider  aspirin EC 81 MG tablet Take 81 mg by mouth daily.   Yes Historical Provider, MD  doxazosin (CARDURA) 4 MG tablet Take 2 mg by mouth 2 (two) times daily.   Yes Historical Provider, MD  finasteride (PROSCAR) 5 MG tablet Take 5 mg by mouth daily.   Yes Historical Provider, MD  lisinopril-hydrochlorothiazide (PRINZIDE,ZESTORETIC) 20-25 MG tablet Take 1 tablet by mouth daily.   Yes Historical Provider, MD  Multiple Vitamin (MULTIVITAMIN WITH MINERALS) TABS tablet Take 1 tablet by mouth daily.   Yes Historical Provider, MD  Omega-3 Fatty Acids (FISH OIL) 1000 MG CAPS Take 1,000 mg by mouth daily.   Yes Historical Provider, MD  omeprazole (PRILOSEC) 40 MG capsule Take 40 mg by mouth daily.   Yes Historical Provider, MD  potassium chloride SA (K-DUR,KLOR-CON) 20 MEQ tablet Take 20 mEq by mouth daily.   Yes Historical Provider, MD  simvastatin (ZOCOR) 20 MG tablet Take 20 mg by mouth daily.   Yes Historical Provider, MD    Physical Exam: Filed Vitals:   08/22/15 2215 08/22/15 2230 08/22/15 2245 08/22/15 2300  BP: 147/59 158/61 155/68 164/88  Pulse: 104 66 66 68  Temp:      TempSrc:      Resp: _0 SpO2: 95% 95% 97% 99%      Constitutional: NAD, calm, comfortable.   Eyes:  PERTLA, pale conjunctivae   ENMT: Mucous membranes are moist. Posterior pharynx clear of any exudate or lesions.   Neck: normal, supple, no masses, no thyromegaly Respiratory: clear to auscultation bilaterally, no wheezing, no crackles. Normal respiratory effort.   Cardiovascular: S1 & S2 heard, regular rate and rhythm. Trace pretibial edema b/l. 2+ pedal pulses. No carotid bruits. No significant JVD. Abdomen: No distension, no tenderness, no masses palpated. Bowel sounds normal.  Musculoskeletal: no clubbing / cyanosis. No joint deformity upper and lower extremities. Normal muscle tone.  Skin: no significant rashes, lesions, ulcers. Warm, dry, well-perfused. Pale Neurologic: CN 2-12 grossly intact. Sensation intact, DTR normal. Strength 5/5 in all 4 limbs.  Psychiatric: Normal judgment and insight. Alert and oriented x 3. Normal mood and affect.     Labs on Admission: I have personally reviewed following labs and imaging studies  CBC:  Recent Labs Lab 08/22/15 2008  WBC 6.7  HGB 6.4*  HCT 23.3*  MCV 74.7*  PLT 700   Basic Metabolic Panel:  Recent Labs Lab 08/22/15 2008  NA 139  K 3.7  CL 109  CO2 23  GLUCOSE 112*  BUN 15  CREATININE 1.57*  CALCIUM 8.8*   GFR: CrCl cannot be calculated (Unknown ideal weight.). Liver Function Tests:  Recent Labs Lab 08/22/15 2008  AST 23  ALT 17  ALKPHOS 44  BILITOT 0.4  PROT 6.1*  ALBUMIN 3.2*   No results for input(s): LIPASE, AMYLASE in the last 168 hours. No results for input(s): AMMONIA in the last 168 hours. Coagulation Profile: No results for input(s): INR, PROTIME in the last 168 hours. Cardiac Enzymes: No results for input(s): CKTOTAL, CKMB, CKMBINDEX, TROPONINI in the last 168 hours. BNP (last 3 results) No results for input(s): PROBNP in the last 8760 hours. HbA1C: No results for input(s): HGBA1C in the last 72 hours. CBG: No results for input(s): GLUCAP in the last 168 hours. Lipid Profile: No results for  input(s): CHOL, HDL, LDLCALC, TRIG, CHOLHDL, LDLDIRECT in the last 72 hours. Thyroid Function Tests: No results for input(s): TSH, T4TOTAL, FREET4, T3FREE, THYROIDAB in the last 72 hours. Anemia Panel:  Recent Labs  08/22/15 2339  RETICCTPCT 2.6   Urine analysis: No results found for: COLORURINE, APPEARANCEUR, LABSPEC, PHURINE, GLUCOSEU, HGBUR, BILIRUBINUR, KETONESUR, PROTEINUR, UROBILINOGEN, NITRITE, LEUKOCYTESUR Sepsis Labs: _1 (procalcitonin:4,lacticidven:4) )No results found for this or any previous visit (from the past 240 hour(s)).   Radiological Exams on Admission: Ct Angio Chest Pe W Or Wo Contrast  08/22/2015  CLINICAL DATA:  Shortness of breath for 1 month. EXAM: CT ANGIOGRAPHY CHEST WITH CONTRAST TECHNIQUE: Multidetector CT imaging of the chest was performed using the standard protocol during bolus administration of intravenous contrast. Multiplanar CT image reconstructions and MIPs were obtained to evaluate the vascular anatomy. CONTRAST:  80 mL Isovue 370 COMPARISON:  Noncontrast chest CT on 02/21/2005 and CAP CT on 08/22/2004 FINDINGS: Mediastinum/Lymph Nodes:  No pulmonary emboli or thoracic aortic dissection identified. Aortic atherosclerosis noted. No evidence of thoracic aortic aneurysm. Normal heart size. No masses or pathologically enlarged lymph nodes identified. Lungs/Pleura: No pulmonary mass, infiltrate, or effusion. Mild centrilobular emphysema again noted. Mild right lower lobe scarring remains stable. A few scattered sub-cm pulmonary nodules in the left upper and lower lobes remain stable since 2006, consistent with benign etiology. Upper abdomen: A complex cystic lesion is seen in the pancreatic neck which measures 3.9 x 2.8 cm on image 105/series 4. This has increased in size from 1.1 x 3.0 cm on previous study in 2006. This is suspicious for a cystic pancreatic neoplasm. Probable small bilateral renal cysts also noted. Musculoskeletal: No chest wall mass or  suspicious bone lesions identified. Review of the MIP images confirms the above findings. IMPRESSION: No evidence of pulmonary embolism or other acute findings within the thorax. Mild emphysema. Aortic atherosclerosis noted. Increased size of complex cystic lesion in the pancreatic neck, suspicious for cystic pancreatic neoplasm. Abdomen MRI and MRCP without and with contrast is recommended for further characterization. Electronically Signed   By: Earle Gell M.D.   On: 08/22/2015 13:41    EKG: Not performed, will obtain as appropriate  Assessment/Plan  1. Symptomatic anemia, microcytic  - Pt denies prior hx of anemia; had been easily fatigued for 1 month, prompting PCP to check CBC  - Hgb reportedly 7 with PCP; initial Hgb 6.4 here  - FOBT negative and pt denies melena or hematochezia, not jaundiced and denies dark urine - Check hemolysis labs, iron studies, B12, folate, reticulocytes  - 2 units ordered for immediate transfusion - RN asked to place order for post-transfusion H&H  - Supplemental O2 prn to keep sats ~100% while significantly anemic   2. Complex cyst, pancreas  - Pt had CTA PE study on 6/20, ordered by PCP for eval of his fatigue - CTA demonstrates interval growth in complex cyst at pancreatic neck since prior study in 2006  - Pt denies wt loss, sweats, fevers, abd discomfort or early satiety  - MRI and MRCP without contrast are advised for further evaluation, can likely be done outpatient    3. CKD stage III  - Pt reports history of CKD stage III but baseline BUN and SCr unknown  - SCr 1.57 on admission  - Avoid nephrotoxins, trend renal fxn    4. Hypertension  - Slightly elevated in ED  - Managed with lisinopril and HCTZ at home, will continue    5. Hyperlipidemia  - Continue current management with Zocor and Lovaza   6. BPH  - Stable  - Continue current management with Cardura and Proscar    DVT prophylaxis: SCD's  Code Status: Full  Family Communication:  Daughter updated at bedside Disposition Plan: Observe on telemetry   Consults called: None  Admission status: Observation     Vianne Bulls, MD [facility] Pager [phone]  If 7PM-7AM, please contact night-coverage [email or web address] Password [email or web address]  08/23/2015, 12:08 AM

## 2015-08-23 NOTE — Progress Notes (Signed)
NURSING PROGRESS NOTE  OLANREWAJU ORTEL UX:[id number] Discharge Data: 08/23/2015 1:41 PM Attending Provider: Murlean Iba, MD YX:[id number], MD     Gerhard Munch to be D/C'd Home per MD order.  Discussed with the patient the After Visit Summary and all questions fully answered. All IV's discontinued with no bleeding noted. All belongings returned to patient for patient to take home.   Last Vital Signs:  Blood pressure 152/81, pulse 56, temperature 97.9 F (36.6 C), temperature source Oral, resp. rate 16, height 5\' 9"  (1.753 m), weight 96.7 kg (213 lb 3 oz), SpO2 99 %.  Discharge Medication List   Medication List    TAKE these medications        aspirin EC 81 MG tablet  Take 81 mg by mouth daily.     docusate sodium 100 MG capsule  Commonly known as:  COLACE  Take 1 capsule (100 mg total) by mouth 2 (two) times daily.     doxazosin 4 MG tablet  Commonly known as:  CARDURA  Take 2 mg by mouth 2 (two) times daily.     ferrous sulfate 325 (65 FE) MG tablet  Take 1 tablet (325 mg total) by mouth daily with breakfast.     finasteride 5 MG tablet  Commonly known as:  PROSCAR  Take 5 mg by mouth daily.     Fish Oil 1000 MG Caps  Take 1,000 mg by mouth daily.     lisinopril-hydrochlorothiazide 20-25 MG tablet  Commonly known as:  PRINZIDE,ZESTORETIC  Take 1 tablet by mouth daily.     multivitamin with minerals Tabs tablet  Take 1 tablet by mouth daily.     omeprazole 40 MG capsule  Commonly known as:  PRILOSEC  Take 40 mg by mouth daily.     potassium chloride SA 20 MEQ tablet  Commonly known as:  K-DUR,KLOR-CON  Take 20 mEq by mouth daily.     simvastatin 20 MG tablet  Commonly known as:  ZOCOR  Take 20 mg by mouth daily.         Doristine Devoid, RN

## 2015-08-23 NOTE — Discharge Instructions (Signed)
Blood Transfusion, Care After These instructions give you information about caring for yourself after your procedure. Your doctor may also give you more specific instructions. Call your doctor if you have any problems or questions after your procedure.  HOME CARE  Take medicines only as told by your doctor. Ask your doctor if you can take an over-the-counter pain reliever if you have a fever or headache a day or two after your procedure.  Return to your normal activities as told by your doctor. GET HELP IF:   You develop redness or irritation at your IV site.  You have a fever, chills, or a headache that does not go away.  Your pee (urine) is darker than normal.  Your urine turns:  Pink.  Red.  Frederick Reed.  The white part of your eye turns yellow (jaundice).  You feel weak after doing your normal activities. GET HELP RIGHT AWAY IF:   You have trouble breathing.  You have fever and chills and you also have:  Anxiety.  Chest or back pain.  Flushed or pink skin.  Clammy or sweaty skin.  A fast heartbeat.  A sick feeling in your stomach (nausea).   This information is not intended to replace advice given to you by your health care provider. Make sure you discuss any questions you have with your health care provider.   Document Released: 03/11/2014 Document Reviewed: 03/11/2014 Elsevier Interactive Patient Education 2016 [employer].  Anemia, Nonspecific Anemia is a condition in which the concentration of red blood cells or hemoglobin in the blood is below normal. Hemoglobin is a substance in red blood cells that carries oxygen to the tissues of the body. Anemia results in not enough oxygen reaching these tissues.  CAUSES  Common causes of anemia include:   Excessive bleeding. Bleeding may be internal or external. This includes excessive bleeding from periods (in women) or from the intestine.   Poor nutrition.   Chronic kidney, thyroid, and liver disease.  Bone  marrow disorders that decrease red blood cell production.  Cancer and treatments for cancer.  HIV, AIDS, and their treatments.  Spleen problems that increase red blood cell destruction.  Blood disorders.  Excess destruction of red blood cells due to infection, medicines, and autoimmune disorders. SIGNS AND SYMPTOMS   Minor weakness.   Dizziness.   Headache.  Palpitations.   Shortness of breath, especially with exercise.   Paleness.  Cold sensitivity.  Indigestion.  Nausea.  Difficulty sleeping.  Difficulty concentrating. Symptoms may occur suddenly or they may develop slowly.  DIAGNOSIS  Additional blood tests are often needed. These help your health care provider determine the best treatment. Your health care provider will check your stool for blood and look for other causes of blood loss.  TREATMENT  Treatment varies depending on the cause of the anemia. Treatment can include:   Supplements of iron, vitamin P32, or folic acid.   Hormone medicines.   A blood transfusion. This may be needed if blood loss is severe.   Hospitalization. This may be needed if there is significant continual blood loss.   Dietary changes.  Spleen removal. HOME CARE INSTRUCTIONS Keep all follow-up appointments. It often takes many weeks to correct anemia, and having your health care provider check on your condition and your response to treatment is very important. SEEK IMMEDIATE MEDICAL CARE IF:   You develop extreme weakness, shortness of breath, or chest pain.   You become dizzy or have trouble concentrating.  You develop heavy vaginal  bleeding.   You develop a rash.   You have bloody or black, tarry stools.   You faint.   You vomit up blood.   You vomit repeatedly.   You have abdominal pain.  You have a fever or persistent symptoms for more than 2-3 days.   You have a fever and your symptoms suddenly get worse.   You are dehydrated.  MAKE  SURE YOU:  Understand these instructions.  Will watch your condition.  Will get help right away if you are not doing well or get worse.   This information is not intended to replace advice given to you by your health care provider. Make sure you discuss any questions you have with your health care provider.   Document Released: 03/28/2004 Document Revised: 10/21/2012 Document Reviewed: 08/14/2012 Elsevier Interactive Patient Education 2016 [employer].   Iron-Rich Diet Iron is a mineral that helps your body to produce hemoglobin. Hemoglobin is a protein in your red blood cells that carries oxygen to your body's tissues. Eating too little iron may cause you to feel weak and tired, and it can increase your risk for infection. Eating enough iron is necessary for your body's metabolism, muscle function, and nervous system. Iron is naturally found in many foods. It can also be added to foods or fortified in foods. There are two types of dietary iron:  Heme iron. Heme iron is absorbed by the body more easily than nonheme iron. Heme iron is found in meat, poultry, and fish.  Nonheme iron. Nonheme iron is found in dietary supplements, iron-fortified grains, beans, and vegetables. You may need to follow an iron-rich diet if:  You have been diagnosed with iron deficiency or iron-deficiency anemia.  You have a condition that prevents you from absorbing dietary iron, such as:  Infection in your intestines.  Celiac disease. This involves long-lasting (chronic) inflammation of your intestines.  You do not eat enough iron.  You eat a diet that is high in foods that impair iron absorption.  You have lost a lot of blood.  You have heavy bleeding during your menstrual cycle.  You are pregnant. WHAT IS MY PLAN? Your health care provider may help you to determine how much iron you need per day based on your condition. Generally, when a person consumes sufficient amounts of iron in the diet,  the following iron needs are met:  Men.  63-8 years old: 11 mg per day.  49-71 years old: 8 mg per day.  Women.   53-21 years old: 15 mg per day.  71-36 years old: 18 mg per day.  Over 30 years old: 8 mg per day.  Pregnant women: 27 mg per day.  Breastfeeding women: 9 mg per day. WHAT DO I NEED TO KNOW ABOUT AN IRON-RICH DIET?  Eat fresh fruits and vegetables that are high in vitamin C along with foods that are high in iron. This will help increase the amount of iron that your body absorbs from food, especially with foods containing nonheme iron. Foods that are high in vitamin C include oranges, peppers, tomatoes, and mango.  Take iron supplements only as directed by your health care provider. Overdose of iron can be life-threatening. If you were prescribed iron supplements, take them with orange juice or a vitamin C supplement.  Cook foods in pots and pans that are made from iron.   Eat nonheme iron-containing foods alongside foods that are high in heme iron. This helps to improve your iron absorption.  Certain foods and drinks contain compounds that impair iron absorption. Avoid eating these foods in the same meal as iron-rich foods or with iron supplements. These include:  Coffee, black tea, and red wine.  Milk, dairy products, and foods that are high in calcium.  Beans, soybeans, and peas.  Whole grains.  When eating foods that contain both nonheme iron and compounds that impair iron absorption, follow these tips to absorb iron better.   Soak beans overnight before cooking.  Soak whole grains overnight and drain them before using.  Ferment flours before baking, such as using yeast in bread dough. WHAT FOODS CAN I EAT? Grains Iron-fortified breakfast cereal. Iron-fortified whole-wheat bread. Enriched rice. Sprouted grains. Vegetables Spinach. Potatoes with skin. Green peas. Broccoli. Red and green bell peppers. Fermented vegetables. Fruits Prunes.  Raisins. Oranges. Strawberries. Mango. Grapefruit. Meats and Other Protein Sources Beef liver. Oysters. Beef. Shrimp. [place]. Chicken. Peck. Sardines. Chickpeas. Nuts. Tofu. Beverages Tomato juice. Fresh orange juice. Prune juice. Hibiscus tea. Fortified instant breakfast shakes. Condiments Tahini. Fermented soy sauce. Sweets and Desserts Black-strap molasses.  Other Wheat germ. The items listed above may not be a complete list of recommended foods or beverages. Contact your dietitian for more options. WHAT FOODS ARE NOT RECOMMENDED? Grains Whole grains. Bran cereal. Bran flour. Oats. Vegetables Artichokes. Brussels sprouts. Kale. Fruits Blueberries. Raspberries. Strawberries. Figs. Meats and Other Protein Sources Soybeans. Products made from soy protein. Dairy Milk. Cream. Cheese. Yogurt. Cottage cheese. Beverages Coffee. Black tea. Red wine. Sweets and Desserts Cocoa. Chocolate. Ice cream. Other Basil. Oregano. Parsley. The items listed above may not be a complete list of foods and beverages to avoid. Contact your dietitian for more information.   This information is not intended to replace advice given to you by your health care provider. Make sure you discuss any questions you have with your health care provider.   Document Released: 10/02/2004 Document Revised: 03/11/2014 Document Reviewed: 09/15/2013 Elsevier Interactive Patient Education 2016 [employer]. Iron Deficiency Anemia, Adult Anemia is when you have a low number of healthy red blood cells. It is often caused by too little iron. This is called iron deficiency anemia. It may make you tired and short of breath. HOME CARE   Take iron as told by your doctor.  Take vitamins as told by your doctor.  Eat foods that have iron in them. This includes liver, lean beef, whole-grain bread, eggs, dried fruit, and dark green leafy vegetables. GET HELP RIGHT AWAY IF:  You pass out (faint).  You have  chest pain.  You feel sick to your stomach (nauseous) or throw up (vomit).  You get very short of breath with activity.  You are weak.  You have a fast heartbeat.  You start to sweat for no reason.  You become light-headed when getting up from a chair or bed. MAKE SURE YOU:  Understand these instructions.  Will watch your condition.  Will get help right away if you are not doing well or get worse.   This information is not intended to replace advice given to you by your health care provider. Make sure you discuss any questions you have with your health care provider.   Document Released: 03/23/2010 Document Revised: 03/11/2014 Document Reviewed: 10/26/2012 Elsevier Interactive Patient Education [employer].

## 2015-08-24 LAB — TYPE AND SCREEN
ABO/RH(D): A NEG
ANTIBODY SCREEN: NEGATIVE
Unit division: 0
Unit division: 0

## 2015-08-24 LAB — HAPTOGLOBIN: HAPTOGLOBIN: 192 mg/dL (ref 34–200)

## 2015-08-28 DIAGNOSIS — D649 Anemia, unspecified: Secondary | ICD-10-CM | POA: Diagnosis not present

## 2015-08-30 DIAGNOSIS — D509 Iron deficiency anemia, unspecified: Secondary | ICD-10-CM | POA: Diagnosis not present

## 2015-08-30 DIAGNOSIS — Z86010 Personal history of colonic polyps: Secondary | ICD-10-CM | POA: Diagnosis not present

## 2015-08-30 DIAGNOSIS — K862 Cyst of pancreas: Secondary | ICD-10-CM | POA: Diagnosis not present

## 2015-09-12 DIAGNOSIS — H268 Other specified cataract: Secondary | ICD-10-CM | POA: Diagnosis not present

## 2015-09-12 DIAGNOSIS — H40002 Preglaucoma, unspecified, left eye: Secondary | ICD-10-CM | POA: Diagnosis not present

## 2015-09-13 ENCOUNTER — Ambulatory Visit
Admission: RE | Admit: 2015-09-13 | Discharge: 2015-09-13 | Disposition: A | Discharge status: Home / Self Care | Payer: Medicare HMO | Source: Ambulatory Visit | Attending: Family Medicine | Admitting: Family Medicine

## 2015-09-13 DIAGNOSIS — K862 Cyst of pancreas: Secondary | ICD-10-CM

## 2015-09-13 DIAGNOSIS — K8689 Other specified diseases of pancreas: Secondary | ICD-10-CM | POA: Diagnosis not present

## 2015-09-13 MED ORDER — GADOBENATE DIMEGLUMINE 529 MG/ML IV SOLN
19.0000 mL | Freq: Once | INTRAVENOUS | Status: AC | PRN
  Administered 2015-09-13: 19 mL via INTRAVENOUS

## 2015-09-26 DIAGNOSIS — D509 Iron deficiency anemia, unspecified: Secondary | ICD-10-CM | POA: Diagnosis not present

## 2015-10-11 DIAGNOSIS — H268 Other specified cataract: Secondary | ICD-10-CM | POA: Diagnosis not present

## 2015-10-11 DIAGNOSIS — H2512 Age-related nuclear cataract, left eye: Secondary | ICD-10-CM | POA: Diagnosis not present

## 2015-10-11 DIAGNOSIS — H40002 Preglaucoma, unspecified, left eye: Secondary | ICD-10-CM | POA: Diagnosis not present

## 2015-10-11 DIAGNOSIS — Z961 Presence of intraocular lens: Secondary | ICD-10-CM | POA: Diagnosis not present

## 2015-10-31 DIAGNOSIS — K862 Cyst of pancreas: Secondary | ICD-10-CM | POA: Diagnosis not present

## 2015-10-31 DIAGNOSIS — D509 Iron deficiency anemia, unspecified: Secondary | ICD-10-CM | POA: Diagnosis not present

## 2015-11-23 DIAGNOSIS — I10 Essential (primary) hypertension: Secondary | ICD-10-CM | POA: Diagnosis not present

## 2015-11-23 DIAGNOSIS — Z0000 Encounter for general adult medical examination without abnormal findings: Secondary | ICD-10-CM | POA: Diagnosis not present

## 2015-11-23 DIAGNOSIS — E7800 Pure hypercholesterolemia, unspecified: Secondary | ICD-10-CM | POA: Diagnosis not present

## 2015-11-23 DIAGNOSIS — K219 Gastro-esophageal reflux disease without esophagitis: Secondary | ICD-10-CM | POA: Diagnosis not present

## 2016-02-13 DIAGNOSIS — E669 Obesity, unspecified: Secondary | ICD-10-CM | POA: Diagnosis not present

## 2016-02-13 DIAGNOSIS — E782 Mixed hyperlipidemia: Secondary | ICD-10-CM | POA: Diagnosis not present

## 2016-02-13 DIAGNOSIS — K219 Gastro-esophageal reflux disease without esophagitis: Secondary | ICD-10-CM | POA: Diagnosis not present

## 2016-02-13 DIAGNOSIS — R7303 Prediabetes: Secondary | ICD-10-CM | POA: Diagnosis not present

## 2016-02-13 DIAGNOSIS — I129 Hypertensive chronic kidney disease with stage 1 through stage 4 chronic kidney disease, or unspecified chronic kidney disease: Secondary | ICD-10-CM | POA: Diagnosis not present

## 2016-02-13 DIAGNOSIS — Z0000 Encounter for general adult medical examination without abnormal findings: Secondary | ICD-10-CM | POA: Diagnosis not present

## 2016-02-13 DIAGNOSIS — N400 Enlarged prostate without lower urinary tract symptoms: Secondary | ICD-10-CM | POA: Diagnosis not present

## 2016-02-13 DIAGNOSIS — M150 Primary generalized (osteo)arthritis: Secondary | ICD-10-CM | POA: Diagnosis not present

## 2016-02-13 DIAGNOSIS — K862 Cyst of pancreas: Secondary | ICD-10-CM | POA: Diagnosis not present

## 2016-02-13 DIAGNOSIS — N183 Chronic kidney disease, stage 3 (moderate): Secondary | ICD-10-CM | POA: Diagnosis not present

## 2016-04-17 DIAGNOSIS — H40002 Preglaucoma, unspecified, left eye: Secondary | ICD-10-CM | POA: Diagnosis not present

## 2016-04-17 DIAGNOSIS — Z961 Presence of intraocular lens: Secondary | ICD-10-CM | POA: Diagnosis not present

## 2016-04-17 DIAGNOSIS — H268 Other specified cataract: Secondary | ICD-10-CM | POA: Diagnosis not present

## 2016-04-17 DIAGNOSIS — H2512 Age-related nuclear cataract, left eye: Secondary | ICD-10-CM | POA: Diagnosis not present

## 2016-06-06 DIAGNOSIS — N401 Enlarged prostate with lower urinary tract symptoms: Secondary | ICD-10-CM | POA: Diagnosis not present

## 2016-06-13 DIAGNOSIS — M545 Low back pain: Secondary | ICD-10-CM | POA: Diagnosis not present

## 2016-06-13 DIAGNOSIS — M6281 Muscle weakness (generalized): Secondary | ICD-10-CM | POA: Diagnosis not present

## 2016-06-13 DIAGNOSIS — N401 Enlarged prostate with lower urinary tract symptoms: Secondary | ICD-10-CM | POA: Diagnosis not present

## 2016-06-13 DIAGNOSIS — R972 Elevated prostate specific antigen [PSA]: Secondary | ICD-10-CM | POA: Diagnosis not present

## 2016-06-13 DIAGNOSIS — R351 Nocturia: Secondary | ICD-10-CM | POA: Diagnosis not present

## 2016-06-13 DIAGNOSIS — M5126 Other intervertebral disc displacement, lumbar region: Secondary | ICD-10-CM | POA: Diagnosis not present

## 2016-06-18 DIAGNOSIS — E669 Obesity, unspecified: Secondary | ICD-10-CM | POA: Diagnosis not present

## 2016-06-18 DIAGNOSIS — K08409 Partial loss of teeth, unspecified cause, unspecified class: Secondary | ICD-10-CM | POA: Diagnosis not present

## 2016-06-18 DIAGNOSIS — I10 Essential (primary) hypertension: Secondary | ICD-10-CM | POA: Diagnosis not present

## 2016-06-18 DIAGNOSIS — K219 Gastro-esophageal reflux disease without esophagitis: Secondary | ICD-10-CM | POA: Diagnosis not present

## 2016-06-18 DIAGNOSIS — E7800 Pure hypercholesterolemia, unspecified: Secondary | ICD-10-CM | POA: Diagnosis not present

## 2016-06-18 DIAGNOSIS — D509 Iron deficiency anemia, unspecified: Secondary | ICD-10-CM | POA: Diagnosis not present

## 2016-06-18 DIAGNOSIS — Z6830 Body mass index (BMI) 30.0-30.9, adult: Secondary | ICD-10-CM | POA: Diagnosis not present

## 2016-06-18 DIAGNOSIS — N400 Enlarged prostate without lower urinary tract symptoms: Secondary | ICD-10-CM | POA: Diagnosis not present

## 2016-06-18 DIAGNOSIS — Z972 Presence of dental prosthetic device (complete) (partial): Secondary | ICD-10-CM | POA: Diagnosis not present

## 2016-06-18 DIAGNOSIS — E876 Hypokalemia: Secondary | ICD-10-CM | POA: Diagnosis not present

## 2016-06-18 DIAGNOSIS — Z0000 Encounter for general adult medical examination without abnormal findings: Secondary | ICD-10-CM | POA: Diagnosis not present

## 2016-06-18 DIAGNOSIS — Z87891 Personal history of nicotine dependence: Secondary | ICD-10-CM | POA: Diagnosis not present

## 2016-08-15 DIAGNOSIS — R7303 Prediabetes: Secondary | ICD-10-CM | POA: Diagnosis not present

## 2016-08-15 DIAGNOSIS — E782 Mixed hyperlipidemia: Secondary | ICD-10-CM | POA: Diagnosis not present

## 2016-08-15 DIAGNOSIS — I129 Hypertensive chronic kidney disease with stage 1 through stage 4 chronic kidney disease, or unspecified chronic kidney disease: Secondary | ICD-10-CM | POA: Diagnosis not present

## 2016-08-15 DIAGNOSIS — N183 Chronic kidney disease, stage 3 (moderate): Secondary | ICD-10-CM | POA: Diagnosis not present

## 2016-08-15 DIAGNOSIS — D509 Iron deficiency anemia, unspecified: Secondary | ICD-10-CM | POA: Diagnosis not present

## 2016-09-19 DIAGNOSIS — L918 Other hypertrophic disorders of the skin: Secondary | ICD-10-CM | POA: Diagnosis not present

## 2016-09-19 DIAGNOSIS — D1801 Hemangioma of skin and subcutaneous tissue: Secondary | ICD-10-CM | POA: Diagnosis not present

## 2016-09-19 DIAGNOSIS — Z85828 Personal history of other malignant neoplasm of skin: Secondary | ICD-10-CM | POA: Diagnosis not present

## 2016-09-19 DIAGNOSIS — D225 Melanocytic nevi of trunk: Secondary | ICD-10-CM | POA: Diagnosis not present

## 2016-09-19 DIAGNOSIS — L814 Other melanin hyperpigmentation: Secondary | ICD-10-CM | POA: Diagnosis not present

## 2016-09-19 DIAGNOSIS — L570 Actinic keratosis: Secondary | ICD-10-CM | POA: Diagnosis not present

## 2016-09-19 DIAGNOSIS — L821 Other seborrheic keratosis: Secondary | ICD-10-CM | POA: Diagnosis not present

## 2017-02-12 DIAGNOSIS — H2512 Age-related nuclear cataract, left eye: Secondary | ICD-10-CM | POA: Diagnosis not present

## 2017-02-12 DIAGNOSIS — H268 Other specified cataract: Secondary | ICD-10-CM | POA: Diagnosis not present

## 2017-02-12 DIAGNOSIS — Z961 Presence of intraocular lens: Secondary | ICD-10-CM | POA: Diagnosis not present

## 2017-02-13 DIAGNOSIS — R7303 Prediabetes: Secondary | ICD-10-CM | POA: Diagnosis not present

## 2017-02-13 DIAGNOSIS — E782 Mixed hyperlipidemia: Secondary | ICD-10-CM | POA: Diagnosis not present

## 2017-02-13 DIAGNOSIS — D509 Iron deficiency anemia, unspecified: Secondary | ICD-10-CM | POA: Diagnosis not present

## 2017-02-13 DIAGNOSIS — Z6831 Body mass index (BMI) 31.0-31.9, adult: Secondary | ICD-10-CM | POA: Diagnosis not present

## 2017-02-13 DIAGNOSIS — I129 Hypertensive chronic kidney disease with stage 1 through stage 4 chronic kidney disease, or unspecified chronic kidney disease: Secondary | ICD-10-CM | POA: Diagnosis not present

## 2017-02-13 DIAGNOSIS — N183 Chronic kidney disease, stage 3 (moderate): Secondary | ICD-10-CM | POA: Diagnosis not present

## 2017-02-13 DIAGNOSIS — E669 Obesity, unspecified: Secondary | ICD-10-CM | POA: Diagnosis not present

## 2017-02-13 DIAGNOSIS — M150 Primary generalized (osteo)arthritis: Secondary | ICD-10-CM | POA: Diagnosis not present

## 2017-02-13 DIAGNOSIS — K219 Gastro-esophageal reflux disease without esophagitis: Secondary | ICD-10-CM | POA: Diagnosis not present

## 2017-02-13 DIAGNOSIS — Z0000 Encounter for general adult medical examination without abnormal findings: Secondary | ICD-10-CM | POA: Diagnosis not present

## 2017-03-20 DIAGNOSIS — Z961 Presence of intraocular lens: Secondary | ICD-10-CM | POA: Diagnosis not present

## 2017-03-20 DIAGNOSIS — H2512 Age-related nuclear cataract, left eye: Secondary | ICD-10-CM | POA: Diagnosis not present

## 2017-04-10 DIAGNOSIS — H25812 Combined forms of age-related cataract, left eye: Secondary | ICD-10-CM | POA: Diagnosis not present

## 2017-06-12 DIAGNOSIS — R972 Elevated prostate specific antigen [PSA]: Secondary | ICD-10-CM | POA: Diagnosis not present

## 2017-06-26 DIAGNOSIS — R972 Elevated prostate specific antigen [PSA]: Secondary | ICD-10-CM | POA: Diagnosis not present

## 2017-06-26 DIAGNOSIS — R351 Nocturia: Secondary | ICD-10-CM | POA: Diagnosis not present

## 2017-06-26 DIAGNOSIS — N401 Enlarged prostate with lower urinary tract symptoms: Secondary | ICD-10-CM | POA: Diagnosis not present

## 2017-09-02 DIAGNOSIS — N183 Chronic kidney disease, stage 3 (moderate): Secondary | ICD-10-CM | POA: Diagnosis not present

## 2017-09-02 DIAGNOSIS — R7303 Prediabetes: Secondary | ICD-10-CM | POA: Diagnosis not present

## 2017-09-02 DIAGNOSIS — I129 Hypertensive chronic kidney disease with stage 1 through stage 4 chronic kidney disease, or unspecified chronic kidney disease: Secondary | ICD-10-CM | POA: Diagnosis not present

## 2017-09-02 DIAGNOSIS — E782 Mixed hyperlipidemia: Secondary | ICD-10-CM | POA: Diagnosis not present

## 2017-09-25 IMAGING — MR MR ABDOMEN WO/W CM
16 of 19 series · 38 of 48 positions shown · IV contrast (multihance)
Comparison: Chest CT 08/22/2015

CLINICAL DATA: Indeterminate pancreatic lesion on chest CT. MRI
recommended for further characterization.

EXAM:
MRI ABDOMEN WITHOUT AND WITH CONTRAST
TECHNIQUE: Multiplanar multisequence MR imaging of the abdomen was performed
both before and after the administration of intravenous contrast.
CONTRAST:  19mL MULTIHANCE GADOBENATE DIMEGLUMINE 529 MG/ML IV SOLN

[Series 4: T2 · axial · 5.0mm · 1.56mm/px · 1 of 38 slices shown (1 of 4)]
[im 1/38]
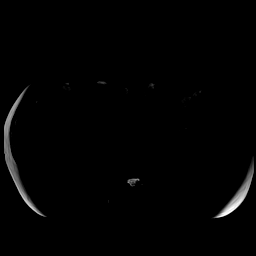

[Series 5: DWI · axial · 5.0mm · 1.42mm/px · z∈[-106,+104]mm · 3 of 108 slices shown (1 of 2)]
[im 1/108]
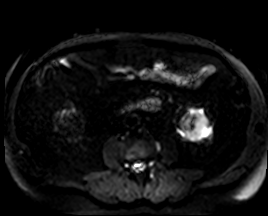
[im 54/108]
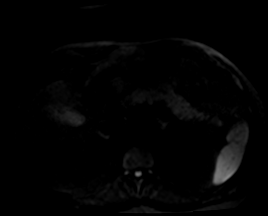
[im 108/108]
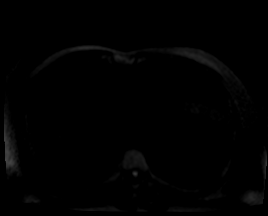

[Series 6: DWI · axial · 5.0mm · 1.42mm/px · 1 of 36 slices shown (2 of 2)]
[im 1/36]
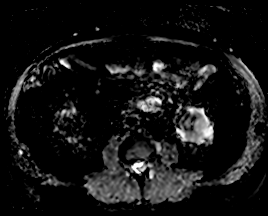

[Series 9: T2 · coronal · 3.0mm · 1.25mm/px · 1 of 19 slices shown (2 of 4)]
[im 1/19]
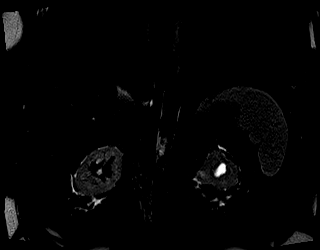

[Series 11: MRCP · coronal · 1.0mm · 0.49mm/px · 3 of 72 slices shown]
[im 1/72]
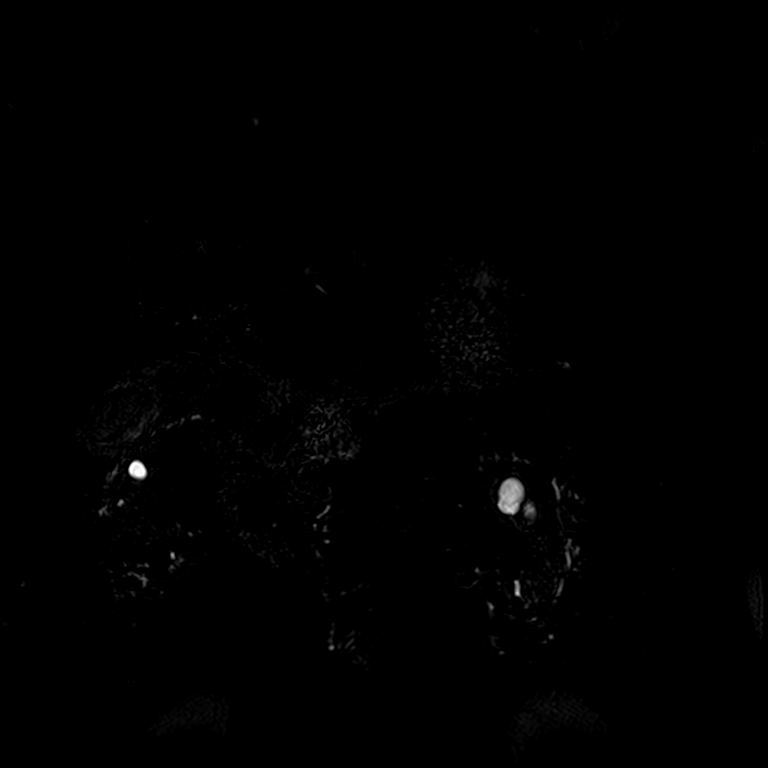
[im 36/72]
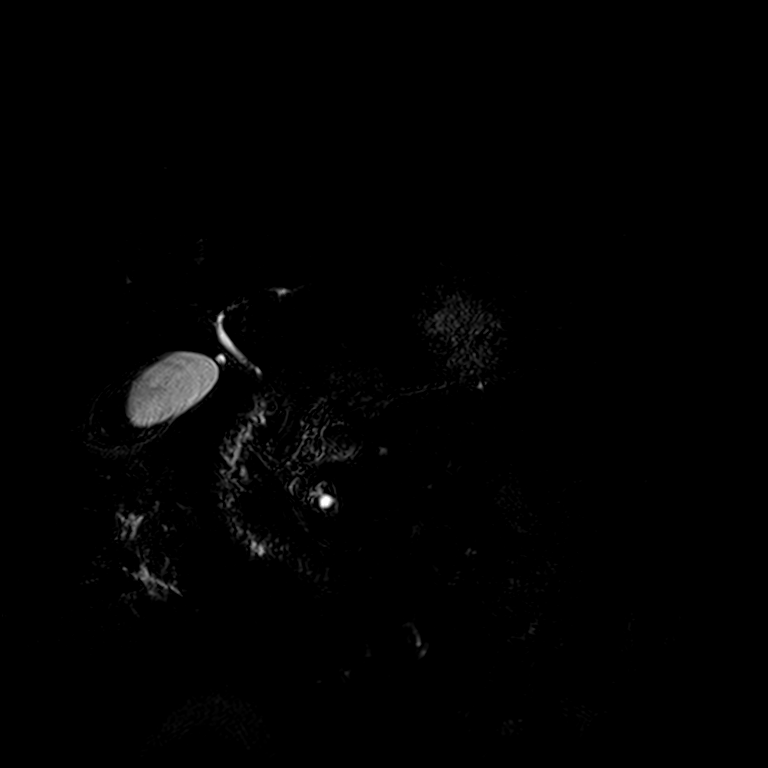
[im 72/72]
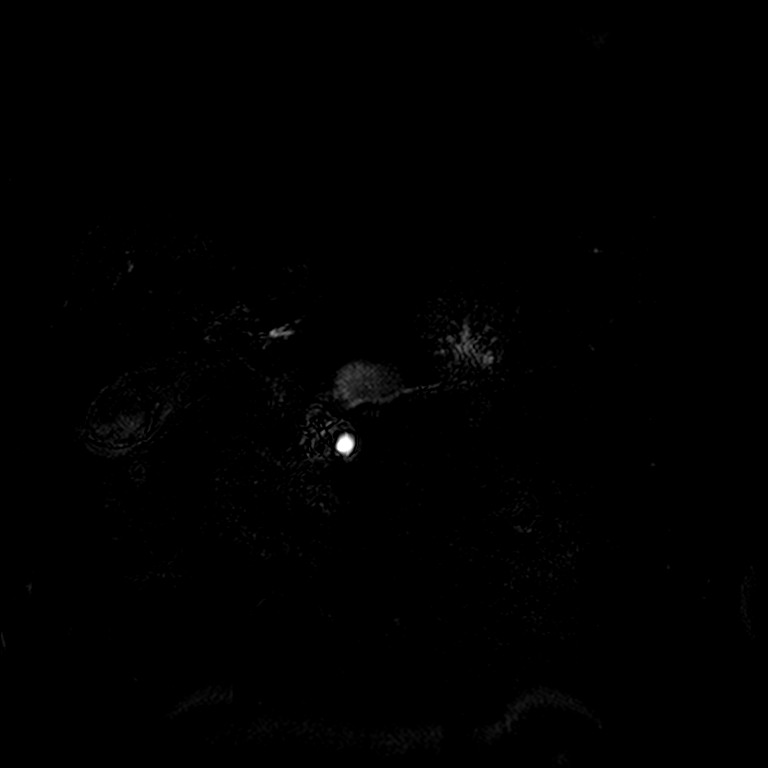

[Series 13: T2 · coronal · 5.0mm · 1.56mm/px · 1 of 36 slices shown (3 of 4)]
[im 1/36]
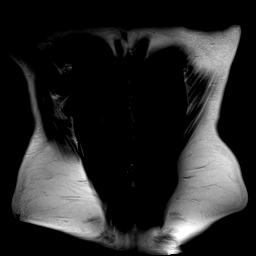

[Series 14: T1 · axial · 3.5mm · 1.25mm/px · z∈[-135,+113]mm · 6 of 144 slices shown]
[im 1/144]
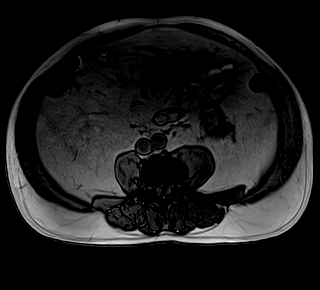
[im 29/144]
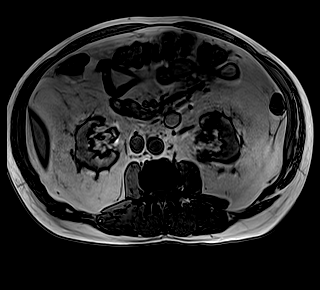
[im 58/144]
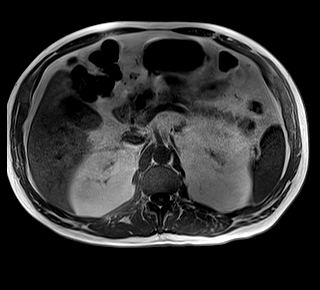
[im 86/144]
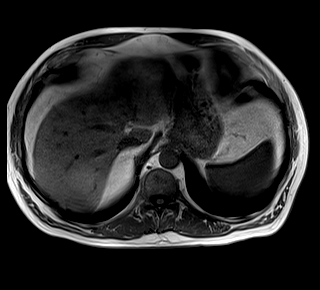
[im 115/144]
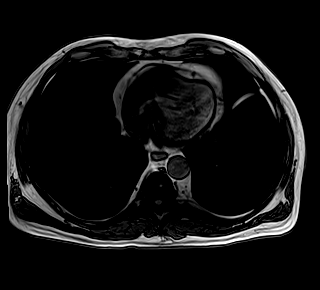
[im 144/144]
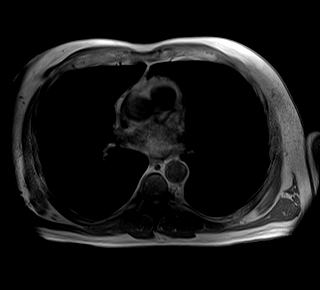

[Series 15: T2 · axial · 6.0mm · 1.25mm/px · 1 of 30 slices shown (4 of 4)]
[im 1/30]
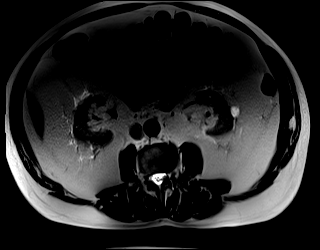

[Series 16: bSSFP · axial · 5.0mm · 1.25mm/px · 1 of 38 slices shown]
[im 1/38]
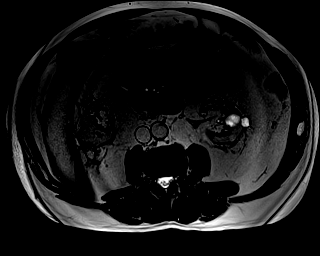

[Series 17: T1 dynamic · axial · non-contrast · 3.0mm · 1.25mm/px · z∈[-104,+109]mm · 3 of 72 slices shown]
[im 1/72]
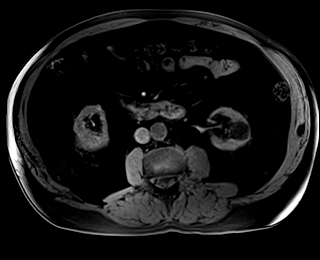
[im 36/72]
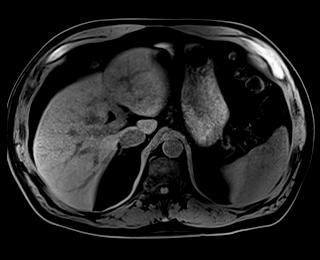
[im 72/72]
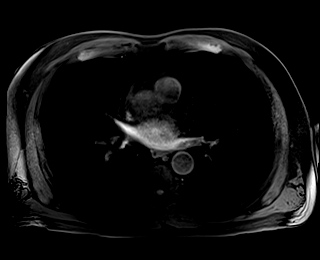

[Series 18: T1 dynamic post-contrast · axial · 3.0mm · 1.25mm/px · z∈[-104,+109]mm · 3 of 72 slices shown (1 of 6)]
[im 1/72]
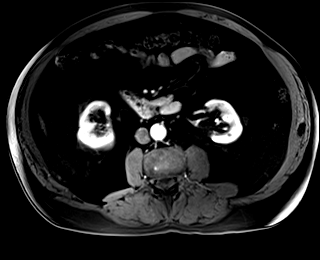
[im 36/72]
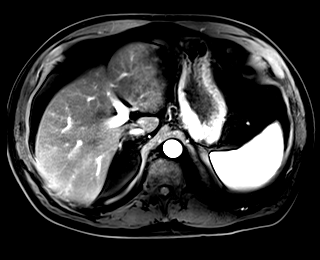
[im 72/72]
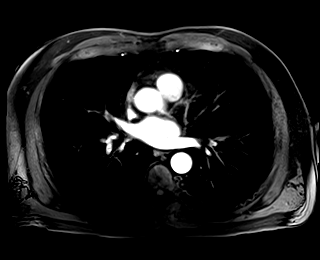

[Series 19: T1 dynamic post-contrast · axial · 3.0mm · 1.25mm/px · z∈[-104,+109]mm · 3 of 72 slices shown (2 of 6)]
[im 1/72]
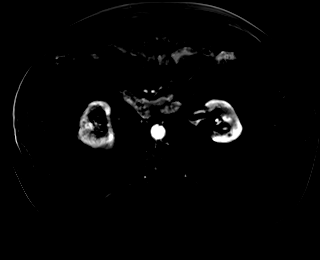
[im 36/72]
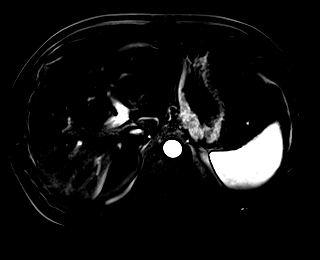
[im 72/72]
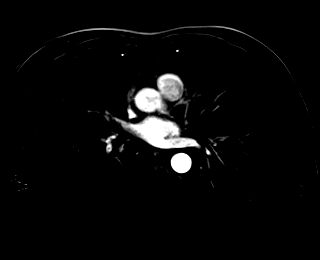

[Series 20: T1 dynamic post-contrast · axial · 3.0mm · 1.25mm/px · z∈[-104,+109]mm · 3 of 72 slices shown (3 of 6)]
[im 1/72]
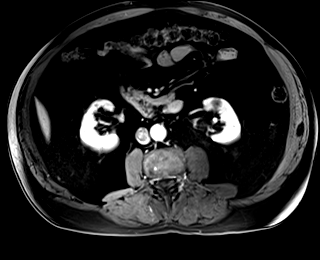
[im 36/72]
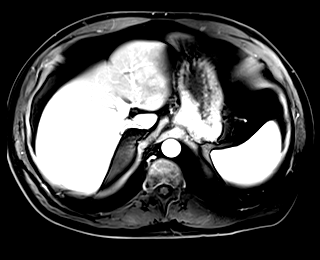
[im 72/72]
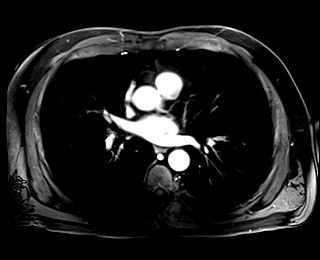

[Series 21: T1 dynamic post-contrast · axial · 3.0mm · 1.25mm/px · z∈[-104,+109]mm · 3 of 72 slices shown (4 of 6)]
[im 1/72]
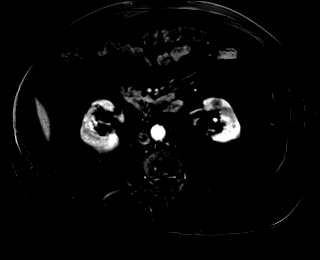
[im 36/72]
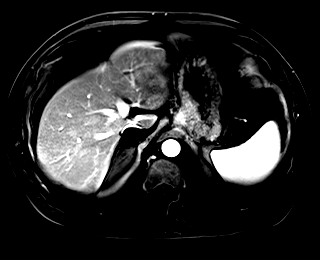
[im 72/72]
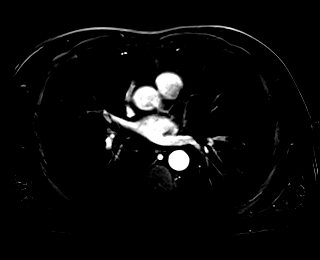

[Series 22: T1 dynamic post-contrast · axial · 3.0mm · 1.25mm/px · z∈[-104,+109]mm · 3 of 72 slices shown (5 of 6)]
[im 1/72]
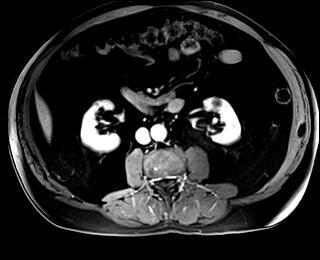
[im 36/72]
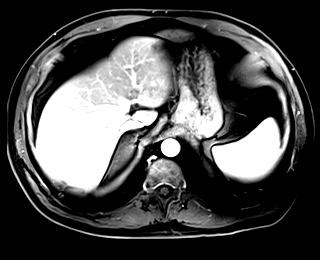
[im 72/72]
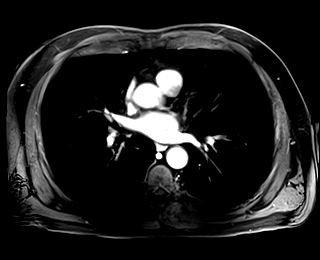

[Series 23: T1 dynamic post-contrast · axial · 3.0mm · 1.25mm/px · z∈[-104,+1]mm · 2 of 72 slices shown (6 of 6)]
[im 1/72]
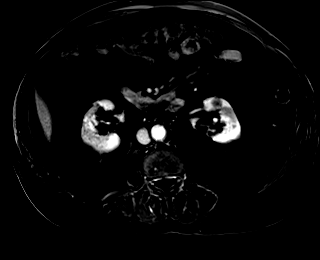
[im 36/72]
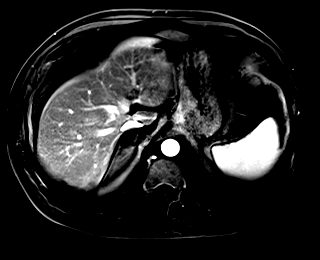

[38 of 48 positions shown; findings below may reference images not displayed]

FINDINGS: Lower chest:  Lung bases are clear.

Hepatobiliary: No focal hepatic lesion. No biliary duct dilatation.
Gallbladder is normal. Common bile duct is normal.

Pancreas: Elongated lobular cystic lesion in the head of the
pancreas increased from remote scan of 02/21/2005. Lesion measures
4.1 x 2.0 cm (image 23, series 15) increased from 3.0 x 1.0 cm on CT
08/22/2004. Lobular lesion has no clear communication with the
pancreatic duct which is normal in caliber. There is a second
similar unilocular cystic lesion in the uncinate measuring 2.5 by
1.6 cm. These lesions do not have any significant internal
complexity and demonstrate no post-contrast enhancement.

There is no pancreatic atrophy.  No peripancreatic lymphadenopathy.

Spleen: Normal spleen.

Adrenals/urinary tract: Adrenal glands normal. There bilateral
nonenhancing renal cysts.

Stomach/Bowel: Stomach and limited of the small bowel is
unremarkable

Vascular/Lymphatic: Abdominal aortic normal caliber. No
retroperitoneal periportal lymphadenopathy.

Musculoskeletal: No aggressive osseous lesion
IMPRESSION: 1. Progressive enlargement of a elongated lobular cystic lesion in
the head of the pancreas and similar lesion adjacent lesion in the
uncinate. Findings favor a slow growing intraductal papillary
mucinous tumor or serous cystadenoma. No communication with the
pancreatic duct is evident. Lesions of this size warrant aspiration
or resection per consensus criteria. It may be prudent to follow
lesion with MRI in 12 months depending on co morbidities. Recommend
gastroenterology consultation.
2. Bosniak 1 renal cysts of the kidneys.
This recommendation follows ACR consensus guidelines: Managing
Incidental Findings on Abdominal CT: White Paper of the ACR
Incidental Findings Committee. [HOSPITAL] [id number];[DATE]

## 2017-10-29 DIAGNOSIS — B078 Other viral warts: Secondary | ICD-10-CM | POA: Diagnosis not present

## 2017-10-29 DIAGNOSIS — L570 Actinic keratosis: Secondary | ICD-10-CM | POA: Diagnosis not present

## 2017-10-29 DIAGNOSIS — Z85828 Personal history of other malignant neoplasm of skin: Secondary | ICD-10-CM | POA: Diagnosis not present

## 2017-10-29 DIAGNOSIS — L821 Other seborrheic keratosis: Secondary | ICD-10-CM | POA: Diagnosis not present

## 2017-10-29 DIAGNOSIS — D692 Other nonthrombocytopenic purpura: Secondary | ICD-10-CM | POA: Diagnosis not present

## 2017-10-29 DIAGNOSIS — D1801 Hemangioma of skin and subcutaneous tissue: Secondary | ICD-10-CM | POA: Diagnosis not present

## 2017-10-29 DIAGNOSIS — L918 Other hypertrophic disorders of the skin: Secondary | ICD-10-CM | POA: Diagnosis not present

## 2017-11-26 DIAGNOSIS — H268 Other specified cataract: Secondary | ICD-10-CM | POA: Diagnosis not present

## 2017-11-26 DIAGNOSIS — H40002 Preglaucoma, unspecified, left eye: Secondary | ICD-10-CM | POA: Diagnosis not present

## 2018-02-26 DIAGNOSIS — I129 Hypertensive chronic kidney disease with stage 1 through stage 4 chronic kidney disease, or unspecified chronic kidney disease: Secondary | ICD-10-CM | POA: Diagnosis not present

## 2018-02-26 DIAGNOSIS — E669 Obesity, unspecified: Secondary | ICD-10-CM | POA: Diagnosis not present

## 2018-02-26 DIAGNOSIS — E161 Other hypoglycemia: Secondary | ICD-10-CM | POA: Diagnosis not present

## 2018-02-26 DIAGNOSIS — D509 Iron deficiency anemia, unspecified: Secondary | ICD-10-CM | POA: Diagnosis not present

## 2018-02-26 DIAGNOSIS — K219 Gastro-esophageal reflux disease without esophagitis: Secondary | ICD-10-CM | POA: Diagnosis not present

## 2018-02-26 DIAGNOSIS — Z0000 Encounter for general adult medical examination without abnormal findings: Secondary | ICD-10-CM | POA: Diagnosis not present

## 2018-02-26 DIAGNOSIS — R7303 Prediabetes: Secondary | ICD-10-CM | POA: Diagnosis not present

## 2018-02-26 DIAGNOSIS — M150 Primary generalized (osteo)arthritis: Secondary | ICD-10-CM | POA: Diagnosis not present

## 2018-02-26 DIAGNOSIS — N183 Chronic kidney disease, stage 3 (moderate): Secondary | ICD-10-CM | POA: Diagnosis not present

## 2018-02-26 DIAGNOSIS — E782 Mixed hyperlipidemia: Secondary | ICD-10-CM | POA: Diagnosis not present

## 2018-06-25 DIAGNOSIS — R972 Elevated prostate specific antigen [PSA]: Secondary | ICD-10-CM | POA: Diagnosis not present

## 2018-07-02 DIAGNOSIS — R972 Elevated prostate specific antigen [PSA]: Secondary | ICD-10-CM | POA: Diagnosis not present

## 2018-07-02 DIAGNOSIS — N401 Enlarged prostate with lower urinary tract symptoms: Secondary | ICD-10-CM | POA: Diagnosis not present

## 2018-07-02 DIAGNOSIS — R351 Nocturia: Secondary | ICD-10-CM | POA: Diagnosis not present

## 2018-07-02 DIAGNOSIS — Z87442 Personal history of urinary calculi: Secondary | ICD-10-CM | POA: Diagnosis not present

## 2018-07-21 DIAGNOSIS — L0390 Cellulitis, unspecified: Secondary | ICD-10-CM | POA: Diagnosis not present

## 2018-08-07 DIAGNOSIS — M79645 Pain in left finger(s): Secondary | ICD-10-CM | POA: Diagnosis not present

## 2018-08-26 DIAGNOSIS — N183 Chronic kidney disease, stage 3 (moderate): Secondary | ICD-10-CM | POA: Diagnosis not present

## 2018-08-26 DIAGNOSIS — R7303 Prediabetes: Secondary | ICD-10-CM | POA: Diagnosis not present

## 2018-08-26 DIAGNOSIS — I129 Hypertensive chronic kidney disease with stage 1 through stage 4 chronic kidney disease, or unspecified chronic kidney disease: Secondary | ICD-10-CM | POA: Diagnosis not present

## 2018-08-26 DIAGNOSIS — E782 Mixed hyperlipidemia: Secondary | ICD-10-CM | POA: Diagnosis not present

## 2018-09-01 DIAGNOSIS — R7303 Prediabetes: Secondary | ICD-10-CM | POA: Diagnosis not present

## 2018-09-01 DIAGNOSIS — E782 Mixed hyperlipidemia: Secondary | ICD-10-CM | POA: Diagnosis not present

## 2018-09-01 DIAGNOSIS — N183 Chronic kidney disease, stage 3 (moderate): Secondary | ICD-10-CM | POA: Diagnosis not present

## 2018-09-01 DIAGNOSIS — Z7189 Other specified counseling: Secondary | ICD-10-CM | POA: Diagnosis not present

## 2018-09-01 DIAGNOSIS — I129 Hypertensive chronic kidney disease with stage 1 through stage 4 chronic kidney disease, or unspecified chronic kidney disease: Secondary | ICD-10-CM | POA: Diagnosis not present

## 2018-09-01 DIAGNOSIS — M109 Gout, unspecified: Secondary | ICD-10-CM | POA: Diagnosis not present

## 2018-12-09 DIAGNOSIS — D1801 Hemangioma of skin and subcutaneous tissue: Secondary | ICD-10-CM | POA: Diagnosis not present

## 2018-12-09 DIAGNOSIS — L821 Other seborrheic keratosis: Secondary | ICD-10-CM | POA: Diagnosis not present

## 2018-12-09 DIAGNOSIS — L918 Other hypertrophic disorders of the skin: Secondary | ICD-10-CM | POA: Diagnosis not present

## 2018-12-09 DIAGNOSIS — L570 Actinic keratosis: Secondary | ICD-10-CM | POA: Diagnosis not present

## 2018-12-09 DIAGNOSIS — Z85828 Personal history of other malignant neoplasm of skin: Secondary | ICD-10-CM | POA: Diagnosis not present

## 2019-02-23 ENCOUNTER — Other Ambulatory Visit: Payer: Self-pay

## 2019-02-23 ENCOUNTER — Ambulatory Visit: Payer: Medicare HMO | Attending: Internal Medicine

## 2019-02-23 ENCOUNTER — Other Ambulatory Visit: Payer: Medicare HMO

## 2019-02-23 DIAGNOSIS — M79645 Pain in left finger(s): Secondary | ICD-10-CM | POA: Diagnosis not present

## 2019-02-23 DIAGNOSIS — Z20822 Contact with and (suspected) exposure to covid-19: Secondary | ICD-10-CM

## 2019-02-23 DIAGNOSIS — Z20828 Contact with and (suspected) exposure to other viral communicable diseases: Secondary | ICD-10-CM | POA: Diagnosis not present

## 2019-02-24 LAB — NOVEL CORONAVIRUS, NAA: SARS-CoV-2, NAA: NOT DETECTED

## 2019-03-18 DIAGNOSIS — M109 Gout, unspecified: Secondary | ICD-10-CM | POA: Diagnosis not present

## 2019-03-18 DIAGNOSIS — R7303 Prediabetes: Secondary | ICD-10-CM | POA: Diagnosis not present

## 2019-03-18 DIAGNOSIS — K219 Gastro-esophageal reflux disease without esophagitis: Secondary | ICD-10-CM | POA: Diagnosis not present

## 2019-03-18 DIAGNOSIS — E782 Mixed hyperlipidemia: Secondary | ICD-10-CM | POA: Diagnosis not present

## 2019-03-18 DIAGNOSIS — Z0000 Encounter for general adult medical examination without abnormal findings: Secondary | ICD-10-CM | POA: Diagnosis not present

## 2019-03-18 DIAGNOSIS — M150 Primary generalized (osteo)arthritis: Secondary | ICD-10-CM | POA: Diagnosis not present

## 2019-03-18 DIAGNOSIS — I129 Hypertensive chronic kidney disease with stage 1 through stage 4 chronic kidney disease, or unspecified chronic kidney disease: Secondary | ICD-10-CM | POA: Diagnosis not present

## 2019-03-18 DIAGNOSIS — D509 Iron deficiency anemia, unspecified: Secondary | ICD-10-CM | POA: Diagnosis not present

## 2019-03-18 DIAGNOSIS — Z6830 Body mass index (BMI) 30.0-30.9, adult: Secondary | ICD-10-CM | POA: Diagnosis not present

## 2019-03-18 DIAGNOSIS — N183 Chronic kidney disease, stage 3 unspecified: Secondary | ICD-10-CM | POA: Diagnosis not present

## 2019-03-23 DIAGNOSIS — E782 Mixed hyperlipidemia: Secondary | ICD-10-CM | POA: Diagnosis not present

## 2019-03-23 DIAGNOSIS — R7309 Other abnormal glucose: Secondary | ICD-10-CM | POA: Diagnosis not present

## 2019-03-23 DIAGNOSIS — M109 Gout, unspecified: Secondary | ICD-10-CM | POA: Diagnosis not present

## 2019-03-23 DIAGNOSIS — D509 Iron deficiency anemia, unspecified: Secondary | ICD-10-CM | POA: Diagnosis not present

## 2019-03-23 DIAGNOSIS — R7303 Prediabetes: Secondary | ICD-10-CM | POA: Diagnosis not present

## 2019-03-23 DIAGNOSIS — I129 Hypertensive chronic kidney disease with stage 1 through stage 4 chronic kidney disease, or unspecified chronic kidney disease: Secondary | ICD-10-CM | POA: Diagnosis not present

## 2019-04-15 DIAGNOSIS — K869 Disease of pancreas, unspecified: Secondary | ICD-10-CM | POA: Diagnosis not present

## 2019-04-15 DIAGNOSIS — D649 Anemia, unspecified: Secondary | ICD-10-CM | POA: Diagnosis not present

## 2019-04-15 DIAGNOSIS — Z86010 Personal history of colonic polyps: Secondary | ICD-10-CM | POA: Diagnosis not present

## 2019-04-20 ENCOUNTER — Other Ambulatory Visit: Payer: Self-pay | Admitting: Gastroenterology

## 2019-04-20 DIAGNOSIS — D509 Iron deficiency anemia, unspecified: Secondary | ICD-10-CM

## 2019-04-20 DIAGNOSIS — Z86010 Personal history of colonic polyps: Secondary | ICD-10-CM

## 2019-04-20 DIAGNOSIS — Z800 Family history of malignant neoplasm of digestive organs: Secondary | ICD-10-CM

## 2019-04-20 DIAGNOSIS — K862 Cyst of pancreas: Secondary | ICD-10-CM

## 2019-04-27 ENCOUNTER — Other Ambulatory Visit: Payer: Self-pay | Admitting: Gastroenterology

## 2019-04-27 DIAGNOSIS — D509 Iron deficiency anemia, unspecified: Secondary | ICD-10-CM

## 2019-05-04 ENCOUNTER — Other Ambulatory Visit: Payer: Medicare HMO

## 2019-05-07 ENCOUNTER — Ambulatory Visit
Admission: RE | Admit: 2019-05-07 | Discharge: 2019-05-07 | Disposition: A | Discharge status: Home / Self Care | Payer: Medicare HMO | Source: Ambulatory Visit | Attending: Gastroenterology | Admitting: Gastroenterology

## 2019-05-07 DIAGNOSIS — D509 Iron deficiency anemia, unspecified: Secondary | ICD-10-CM

## 2019-05-27 ENCOUNTER — Other Ambulatory Visit: Payer: Self-pay

## 2019-05-27 ENCOUNTER — Ambulatory Visit
Admission: RE | Admit: 2019-05-27 | Discharge: 2019-05-27 | Disposition: A | Discharge status: Home / Self Care | Payer: Medicare HMO | Source: Ambulatory Visit | Attending: Gastroenterology | Admitting: Gastroenterology

## 2019-05-27 DIAGNOSIS — D509 Iron deficiency anemia, unspecified: Secondary | ICD-10-CM

## 2019-05-27 DIAGNOSIS — K862 Cyst of pancreas: Secondary | ICD-10-CM

## 2019-05-27 DIAGNOSIS — Z86010 Personal history of colonic polyps: Secondary | ICD-10-CM

## 2019-05-27 DIAGNOSIS — Z800 Family history of malignant neoplasm of digestive organs: Secondary | ICD-10-CM

## 2019-05-27 MED ORDER — IOPAMIDOL (ISOVUE-300) INJECTION 61%
100.0000 mL | Freq: Once | INTRAVENOUS | Status: AC | PRN
  Administered 2019-05-27: 100 mL via INTRAVENOUS

## 2019-07-01 DIAGNOSIS — R972 Elevated prostate specific antigen [PSA]: Secondary | ICD-10-CM | POA: Diagnosis not present

## 2019-07-06 DIAGNOSIS — K219 Gastro-esophageal reflux disease without esophagitis: Secondary | ICD-10-CM | POA: Diagnosis not present

## 2019-07-06 DIAGNOSIS — D649 Anemia, unspecified: Secondary | ICD-10-CM | POA: Diagnosis not present

## 2019-07-06 DIAGNOSIS — E669 Obesity, unspecified: Secondary | ICD-10-CM | POA: Diagnosis not present

## 2019-07-06 DIAGNOSIS — N400 Enlarged prostate without lower urinary tract symptoms: Secondary | ICD-10-CM | POA: Diagnosis not present

## 2019-07-06 DIAGNOSIS — Z85828 Personal history of other malignant neoplasm of skin: Secondary | ICD-10-CM | POA: Diagnosis not present

## 2019-07-06 DIAGNOSIS — I951 Orthostatic hypotension: Secondary | ICD-10-CM | POA: Diagnosis not present

## 2019-07-06 DIAGNOSIS — R030 Elevated blood-pressure reading, without diagnosis of hypertension: Secondary | ICD-10-CM | POA: Diagnosis not present

## 2019-07-06 DIAGNOSIS — M109 Gout, unspecified: Secondary | ICD-10-CM | POA: Diagnosis not present

## 2019-07-06 DIAGNOSIS — E785 Hyperlipidemia, unspecified: Secondary | ICD-10-CM | POA: Diagnosis not present

## 2019-07-06 DIAGNOSIS — Z7982 Long term (current) use of aspirin: Secondary | ICD-10-CM | POA: Diagnosis not present

## 2019-07-08 DIAGNOSIS — R972 Elevated prostate specific antigen [PSA]: Secondary | ICD-10-CM | POA: Diagnosis not present

## 2019-07-08 DIAGNOSIS — R351 Nocturia: Secondary | ICD-10-CM | POA: Diagnosis not present

## 2019-07-08 DIAGNOSIS — N401 Enlarged prostate with lower urinary tract symptoms: Secondary | ICD-10-CM | POA: Diagnosis not present

## 2019-09-16 DIAGNOSIS — D649 Anemia, unspecified: Secondary | ICD-10-CM | POA: Diagnosis not present

## 2019-09-16 DIAGNOSIS — E782 Mixed hyperlipidemia: Secondary | ICD-10-CM | POA: Diagnosis not present

## 2019-09-16 DIAGNOSIS — M109 Gout, unspecified: Secondary | ICD-10-CM | POA: Diagnosis not present

## 2019-09-16 DIAGNOSIS — N183 Chronic kidney disease, stage 3 unspecified: Secondary | ICD-10-CM | POA: Diagnosis not present

## 2019-09-16 DIAGNOSIS — R7309 Other abnormal glucose: Secondary | ICD-10-CM | POA: Diagnosis not present

## 2019-09-16 DIAGNOSIS — I129 Hypertensive chronic kidney disease with stage 1 through stage 4 chronic kidney disease, or unspecified chronic kidney disease: Secondary | ICD-10-CM | POA: Diagnosis not present

## 2019-10-13 DIAGNOSIS — R3121 Asymptomatic microscopic hematuria: Secondary | ICD-10-CM | POA: Diagnosis not present

## 2019-10-19 DIAGNOSIS — D649 Anemia, unspecified: Secondary | ICD-10-CM | POA: Diagnosis not present

## 2019-12-02 DIAGNOSIS — N401 Enlarged prostate with lower urinary tract symptoms: Secondary | ICD-10-CM | POA: Diagnosis not present

## 2019-12-02 DIAGNOSIS — R3121 Asymptomatic microscopic hematuria: Secondary | ICD-10-CM | POA: Diagnosis not present

## 2019-12-02 DIAGNOSIS — R351 Nocturia: Secondary | ICD-10-CM | POA: Diagnosis not present

## 2019-12-04 DIAGNOSIS — D649 Anemia, unspecified: Secondary | ICD-10-CM | POA: Diagnosis not present

## 2019-12-04 DIAGNOSIS — R339 Retention of urine, unspecified: Secondary | ICD-10-CM | POA: Diagnosis not present

## 2019-12-04 DIAGNOSIS — R319 Hematuria, unspecified: Secondary | ICD-10-CM | POA: Diagnosis not present

## 2019-12-06 DIAGNOSIS — R338 Other retention of urine: Secondary | ICD-10-CM | POA: Diagnosis not present

## 2019-12-09 DIAGNOSIS — R351 Nocturia: Secondary | ICD-10-CM | POA: Diagnosis not present

## 2019-12-09 DIAGNOSIS — R338 Other retention of urine: Secondary | ICD-10-CM | POA: Diagnosis not present

## 2019-12-09 DIAGNOSIS — N401 Enlarged prostate with lower urinary tract symptoms: Secondary | ICD-10-CM | POA: Diagnosis not present

## 2019-12-15 DIAGNOSIS — R338 Other retention of urine: Secondary | ICD-10-CM | POA: Diagnosis not present

## 2019-12-15 DIAGNOSIS — R3121 Asymptomatic microscopic hematuria: Secondary | ICD-10-CM | POA: Diagnosis not present

## 2019-12-15 DIAGNOSIS — N401 Enlarged prostate with lower urinary tract symptoms: Secondary | ICD-10-CM | POA: Diagnosis not present

## 2019-12-15 DIAGNOSIS — R351 Nocturia: Secondary | ICD-10-CM | POA: Diagnosis not present

## 2020-01-03 DIAGNOSIS — R7989 Other specified abnormal findings of blood chemistry: Secondary | ICD-10-CM | POA: Diagnosis not present

## 2020-01-03 DIAGNOSIS — D61818 Other pancytopenia: Secondary | ICD-10-CM | POA: Diagnosis not present

## 2020-01-03 DIAGNOSIS — D649 Anemia, unspecified: Secondary | ICD-10-CM | POA: Diagnosis not present

## 2020-03-08 DIAGNOSIS — L918 Other hypertrophic disorders of the skin: Secondary | ICD-10-CM | POA: Diagnosis not present

## 2020-03-08 DIAGNOSIS — Z85828 Personal history of other malignant neoplasm of skin: Secondary | ICD-10-CM | POA: Diagnosis not present

## 2020-03-08 DIAGNOSIS — L570 Actinic keratosis: Secondary | ICD-10-CM | POA: Diagnosis not present

## 2020-03-08 DIAGNOSIS — D692 Other nonthrombocytopenic purpura: Secondary | ICD-10-CM | POA: Diagnosis not present

## 2020-03-08 DIAGNOSIS — C44319 Basal cell carcinoma of skin of other parts of face: Secondary | ICD-10-CM | POA: Diagnosis not present

## 2020-03-08 DIAGNOSIS — L853 Xerosis cutis: Secondary | ICD-10-CM | POA: Diagnosis not present

## 2020-03-08 DIAGNOSIS — L821 Other seborrheic keratosis: Secondary | ICD-10-CM | POA: Diagnosis not present

## 2020-03-08 DIAGNOSIS — D485 Neoplasm of uncertain behavior of skin: Secondary | ICD-10-CM | POA: Diagnosis not present

## 2020-03-30 DIAGNOSIS — R6881 Early satiety: Secondary | ICD-10-CM | POA: Diagnosis not present

## 2020-03-30 DIAGNOSIS — E669 Obesity, unspecified: Secondary | ICD-10-CM | POA: Diagnosis not present

## 2020-03-30 DIAGNOSIS — Z0000 Encounter for general adult medical examination without abnormal findings: Secondary | ICD-10-CM | POA: Diagnosis not present

## 2020-03-30 DIAGNOSIS — R7303 Prediabetes: Secondary | ICD-10-CM | POA: Diagnosis not present

## 2020-03-30 DIAGNOSIS — K219 Gastro-esophageal reflux disease without esophagitis: Secondary | ICD-10-CM | POA: Diagnosis not present

## 2020-03-30 DIAGNOSIS — H9122 Sudden idiopathic hearing loss, left ear: Secondary | ICD-10-CM | POA: Diagnosis not present

## 2020-03-30 DIAGNOSIS — E782 Mixed hyperlipidemia: Secondary | ICD-10-CM | POA: Diagnosis not present

## 2020-03-30 DIAGNOSIS — D509 Iron deficiency anemia, unspecified: Secondary | ICD-10-CM | POA: Diagnosis not present

## 2020-03-30 DIAGNOSIS — N183 Chronic kidney disease, stage 3 unspecified: Secondary | ICD-10-CM | POA: Diagnosis not present

## 2020-03-30 DIAGNOSIS — M150 Primary generalized (osteo)arthritis: Secondary | ICD-10-CM | POA: Diagnosis not present

## 2020-03-30 DIAGNOSIS — I129 Hypertensive chronic kidney disease with stage 1 through stage 4 chronic kidney disease, or unspecified chronic kidney disease: Secondary | ICD-10-CM | POA: Diagnosis not present

## 2020-03-30 DIAGNOSIS — R42 Dizziness and giddiness: Secondary | ICD-10-CM | POA: Diagnosis not present

## 2020-04-05 DIAGNOSIS — K828 Other specified diseases of gallbladder: Secondary | ICD-10-CM | POA: Diagnosis not present

## 2020-04-05 DIAGNOSIS — K862 Cyst of pancreas: Secondary | ICD-10-CM | POA: Diagnosis not present

## 2020-04-05 DIAGNOSIS — N281 Cyst of kidney, acquired: Secondary | ICD-10-CM | POA: Diagnosis not present

## 2020-04-05 DIAGNOSIS — K824 Cholesterolosis of gallbladder: Secondary | ICD-10-CM | POA: Diagnosis not present

## 2020-04-05 DIAGNOSIS — D61818 Other pancytopenia: Secondary | ICD-10-CM | POA: Diagnosis not present

## 2020-04-11 DIAGNOSIS — H903 Sensorineural hearing loss, bilateral: Secondary | ICD-10-CM | POA: Diagnosis not present

## 2020-04-11 DIAGNOSIS — H9122 Sudden idiopathic hearing loss, left ear: Secondary | ICD-10-CM | POA: Diagnosis not present

## 2020-04-21 DIAGNOSIS — H9122 Sudden idiopathic hearing loss, left ear: Secondary | ICD-10-CM | POA: Diagnosis not present

## 2020-04-21 DIAGNOSIS — H905 Unspecified sensorineural hearing loss: Secondary | ICD-10-CM | POA: Diagnosis not present

## 2020-04-25 DIAGNOSIS — H9042 Sensorineural hearing loss, unilateral, left ear, with unrestricted hearing on the contralateral side: Secondary | ICD-10-CM | POA: Diagnosis not present

## 2020-04-25 DIAGNOSIS — H938X3 Other specified disorders of ear, bilateral: Secondary | ICD-10-CM | POA: Diagnosis not present

## 2020-04-25 DIAGNOSIS — H9122 Sudden idiopathic hearing loss, left ear: Secondary | ICD-10-CM | POA: Diagnosis not present

## 2020-05-02 DIAGNOSIS — H9122 Sudden idiopathic hearing loss, left ear: Secondary | ICD-10-CM | POA: Diagnosis not present

## 2020-05-09 DIAGNOSIS — H9122 Sudden idiopathic hearing loss, left ear: Secondary | ICD-10-CM | POA: Diagnosis not present

## 2020-05-17 DIAGNOSIS — N401 Enlarged prostate with lower urinary tract symptoms: Secondary | ICD-10-CM | POA: Diagnosis not present

## 2020-05-23 DIAGNOSIS — H9122 Sudden idiopathic hearing loss, left ear: Secondary | ICD-10-CM | POA: Diagnosis not present

## 2020-05-24 DIAGNOSIS — R3121 Asymptomatic microscopic hematuria: Secondary | ICD-10-CM | POA: Diagnosis not present

## 2020-05-24 DIAGNOSIS — R972 Elevated prostate specific antigen [PSA]: Secondary | ICD-10-CM | POA: Diagnosis not present

## 2020-05-24 DIAGNOSIS — N401 Enlarged prostate with lower urinary tract symptoms: Secondary | ICD-10-CM | POA: Diagnosis not present

## 2020-05-24 DIAGNOSIS — R351 Nocturia: Secondary | ICD-10-CM | POA: Diagnosis not present

## 2020-08-14 DIAGNOSIS — D61818 Other pancytopenia: Secondary | ICD-10-CM | POA: Diagnosis not present

## 2020-08-14 DIAGNOSIS — D649 Anemia, unspecified: Secondary | ICD-10-CM | POA: Diagnosis not present

## 2020-08-14 DIAGNOSIS — D696 Thrombocytopenia, unspecified: Secondary | ICD-10-CM | POA: Diagnosis not present

## 2020-10-27 DIAGNOSIS — I129 Hypertensive chronic kidney disease with stage 1 through stage 4 chronic kidney disease, or unspecified chronic kidney disease: Secondary | ICD-10-CM | POA: Diagnosis not present

## 2020-10-27 DIAGNOSIS — N183 Chronic kidney disease, stage 3 unspecified: Secondary | ICD-10-CM | POA: Diagnosis not present

## 2020-10-27 DIAGNOSIS — R7303 Prediabetes: Secondary | ICD-10-CM | POA: Diagnosis not present

## 2020-10-27 DIAGNOSIS — E782 Mixed hyperlipidemia: Secondary | ICD-10-CM | POA: Diagnosis not present

## 2020-10-27 DIAGNOSIS — R69 Illness, unspecified: Secondary | ICD-10-CM | POA: Diagnosis not present

## 2020-11-22 DIAGNOSIS — R972 Elevated prostate specific antigen [PSA]: Secondary | ICD-10-CM | POA: Diagnosis not present

## 2020-11-29 DIAGNOSIS — R972 Elevated prostate specific antigen [PSA]: Secondary | ICD-10-CM | POA: Diagnosis not present

## 2020-11-29 DIAGNOSIS — Z87442 Personal history of urinary calculi: Secondary | ICD-10-CM | POA: Diagnosis not present

## 2020-11-29 DIAGNOSIS — N401 Enlarged prostate with lower urinary tract symptoms: Secondary | ICD-10-CM | POA: Diagnosis not present

## 2020-11-29 DIAGNOSIS — R351 Nocturia: Secondary | ICD-10-CM | POA: Diagnosis not present

## 2020-12-19 DIAGNOSIS — D61818 Other pancytopenia: Secondary | ICD-10-CM | POA: Diagnosis not present

## 2021-04-05 DIAGNOSIS — Z0000 Encounter for general adult medical examination without abnormal findings: Secondary | ICD-10-CM | POA: Diagnosis not present

## 2021-04-05 DIAGNOSIS — D696 Thrombocytopenia, unspecified: Secondary | ICD-10-CM | POA: Diagnosis not present

## 2021-04-05 DIAGNOSIS — D509 Iron deficiency anemia, unspecified: Secondary | ICD-10-CM | POA: Diagnosis not present

## 2021-04-05 DIAGNOSIS — E669 Obesity, unspecified: Secondary | ICD-10-CM | POA: Diagnosis not present

## 2021-04-05 DIAGNOSIS — R7303 Prediabetes: Secondary | ICD-10-CM | POA: Diagnosis not present

## 2021-04-05 DIAGNOSIS — M109 Gout, unspecified: Secondary | ICD-10-CM | POA: Diagnosis not present

## 2021-04-05 DIAGNOSIS — I129 Hypertensive chronic kidney disease with stage 1 through stage 4 chronic kidney disease, or unspecified chronic kidney disease: Secondary | ICD-10-CM | POA: Diagnosis not present

## 2021-04-05 DIAGNOSIS — E782 Mixed hyperlipidemia: Secondary | ICD-10-CM | POA: Diagnosis not present

## 2021-04-05 DIAGNOSIS — K219 Gastro-esophageal reflux disease without esophagitis: Secondary | ICD-10-CM | POA: Diagnosis not present

## 2021-04-05 DIAGNOSIS — N183 Chronic kidney disease, stage 3 unspecified: Secondary | ICD-10-CM | POA: Diagnosis not present

## 2021-06-28 DIAGNOSIS — D61818 Other pancytopenia: Secondary | ICD-10-CM | POA: Diagnosis not present

## 2021-06-28 DIAGNOSIS — D649 Anemia, unspecified: Secondary | ICD-10-CM | POA: Diagnosis not present

## 2021-11-16 DIAGNOSIS — I129 Hypertensive chronic kidney disease with stage 1 through stage 4 chronic kidney disease, or unspecified chronic kidney disease: Secondary | ICD-10-CM | POA: Diagnosis not present

## 2021-11-16 DIAGNOSIS — N183 Chronic kidney disease, stage 3 unspecified: Secondary | ICD-10-CM | POA: Diagnosis not present

## 2021-11-16 DIAGNOSIS — E782 Mixed hyperlipidemia: Secondary | ICD-10-CM | POA: Diagnosis not present

## 2021-11-16 DIAGNOSIS — R7303 Prediabetes: Secondary | ICD-10-CM | POA: Diagnosis not present

## 2021-11-16 DIAGNOSIS — L918 Other hypertrophic disorders of the skin: Secondary | ICD-10-CM | POA: Diagnosis not present

## 2021-11-16 DIAGNOSIS — R972 Elevated prostate specific antigen [PSA]: Secondary | ICD-10-CM | POA: Diagnosis not present

## 2021-11-28 DIAGNOSIS — R3121 Asymptomatic microscopic hematuria: Secondary | ICD-10-CM | POA: Diagnosis not present

## 2021-11-28 DIAGNOSIS — N401 Enlarged prostate with lower urinary tract symptoms: Secondary | ICD-10-CM | POA: Diagnosis not present

## 2021-11-28 DIAGNOSIS — R972 Elevated prostate specific antigen [PSA]: Secondary | ICD-10-CM | POA: Diagnosis not present

## 2021-11-28 DIAGNOSIS — R351 Nocturia: Secondary | ICD-10-CM | POA: Diagnosis not present

## 2022-02-05 DIAGNOSIS — D61818 Other pancytopenia: Secondary | ICD-10-CM | POA: Diagnosis not present

## 2022-03-05 DIAGNOSIS — R051 Acute cough: Secondary | ICD-10-CM | POA: Diagnosis not present

## 2022-03-05 DIAGNOSIS — R0600 Dyspnea, unspecified: Secondary | ICD-10-CM | POA: Diagnosis not present

## 2022-03-05 DIAGNOSIS — J9801 Acute bronchospasm: Secondary | ICD-10-CM | POA: Diagnosis not present

## 2022-03-05 DIAGNOSIS — J069 Acute upper respiratory infection, unspecified: Secondary | ICD-10-CM | POA: Diagnosis not present

## 2022-04-26 DIAGNOSIS — N183 Chronic kidney disease, stage 3 unspecified: Secondary | ICD-10-CM | POA: Diagnosis not present

## 2022-04-26 DIAGNOSIS — I129 Hypertensive chronic kidney disease with stage 1 through stage 4 chronic kidney disease, or unspecified chronic kidney disease: Secondary | ICD-10-CM | POA: Diagnosis not present

## 2022-04-26 DIAGNOSIS — D696 Thrombocytopenia, unspecified: Secondary | ICD-10-CM | POA: Diagnosis not present

## 2022-04-26 DIAGNOSIS — Z0000 Encounter for general adult medical examination without abnormal findings: Secondary | ICD-10-CM | POA: Diagnosis not present

## 2022-04-26 DIAGNOSIS — M109 Gout, unspecified: Secondary | ICD-10-CM | POA: Diagnosis not present

## 2022-04-26 DIAGNOSIS — R7303 Prediabetes: Secondary | ICD-10-CM | POA: Diagnosis not present

## 2022-04-26 DIAGNOSIS — E782 Mixed hyperlipidemia: Secondary | ICD-10-CM | POA: Diagnosis not present

## 2022-04-26 DIAGNOSIS — E669 Obesity, unspecified: Secondary | ICD-10-CM | POA: Diagnosis not present

## 2022-04-26 DIAGNOSIS — D509 Iron deficiency anemia, unspecified: Secondary | ICD-10-CM | POA: Diagnosis not present

## 2022-04-26 DIAGNOSIS — K219 Gastro-esophageal reflux disease without esophagitis: Secondary | ICD-10-CM | POA: Diagnosis not present

## 2022-10-16 DIAGNOSIS — D61818 Other pancytopenia: Secondary | ICD-10-CM | POA: Diagnosis not present

## 2023-01-27 DIAGNOSIS — H903 Sensorineural hearing loss, bilateral: Secondary | ICD-10-CM | POA: Diagnosis not present

## 2023-02-25 DIAGNOSIS — H903 Sensorineural hearing loss, bilateral: Secondary | ICD-10-CM | POA: Diagnosis not present

## 2023-04-30 DIAGNOSIS — D509 Iron deficiency anemia, unspecified: Secondary | ICD-10-CM | POA: Diagnosis not present

## 2023-04-30 DIAGNOSIS — K219 Gastro-esophageal reflux disease without esophagitis: Secondary | ICD-10-CM | POA: Diagnosis not present

## 2023-04-30 DIAGNOSIS — R7303 Prediabetes: Secondary | ICD-10-CM | POA: Diagnosis not present

## 2023-04-30 DIAGNOSIS — I129 Hypertensive chronic kidney disease with stage 1 through stage 4 chronic kidney disease, or unspecified chronic kidney disease: Secondary | ICD-10-CM | POA: Diagnosis not present

## 2023-04-30 DIAGNOSIS — D696 Thrombocytopenia, unspecified: Secondary | ICD-10-CM | POA: Diagnosis not present

## 2023-04-30 DIAGNOSIS — N400 Enlarged prostate without lower urinary tract symptoms: Secondary | ICD-10-CM | POA: Diagnosis not present

## 2023-04-30 DIAGNOSIS — E669 Obesity, unspecified: Secondary | ICD-10-CM | POA: Diagnosis not present

## 2023-04-30 DIAGNOSIS — E782 Mixed hyperlipidemia: Secondary | ICD-10-CM | POA: Diagnosis not present

## 2023-04-30 DIAGNOSIS — Z23 Encounter for immunization: Secondary | ICD-10-CM | POA: Diagnosis not present

## 2023-04-30 DIAGNOSIS — Z0000 Encounter for general adult medical examination without abnormal findings: Secondary | ICD-10-CM | POA: Diagnosis not present

## 2023-04-30 DIAGNOSIS — N183 Chronic kidney disease, stage 3 unspecified: Secondary | ICD-10-CM | POA: Diagnosis not present

## 2023-04-30 DIAGNOSIS — M109 Gout, unspecified: Secondary | ICD-10-CM | POA: Diagnosis not present

## 2023-10-14 DIAGNOSIS — N401 Enlarged prostate with lower urinary tract symptoms: Secondary | ICD-10-CM | POA: Diagnosis not present

## 2023-10-14 DIAGNOSIS — R351 Nocturia: Secondary | ICD-10-CM | POA: Diagnosis not present

## 2023-11-05 DIAGNOSIS — D61818 Other pancytopenia: Secondary | ICD-10-CM | POA: Diagnosis not present

## 2023-11-12 DIAGNOSIS — I129 Hypertensive chronic kidney disease with stage 1 through stage 4 chronic kidney disease, or unspecified chronic kidney disease: Secondary | ICD-10-CM | POA: Diagnosis not present

## 2023-11-12 DIAGNOSIS — N183 Chronic kidney disease, stage 3 unspecified: Secondary | ICD-10-CM | POA: Diagnosis not present

## 2023-11-12 DIAGNOSIS — D61818 Other pancytopenia: Secondary | ICD-10-CM | POA: Diagnosis not present

## 2023-11-12 DIAGNOSIS — R7303 Prediabetes: Secondary | ICD-10-CM | POA: Diagnosis not present

## 2023-11-12 DIAGNOSIS — E782 Mixed hyperlipidemia: Secondary | ICD-10-CM | POA: Diagnosis not present

## 2023-12-12 DIAGNOSIS — L03116 Cellulitis of left lower limb: Secondary | ICD-10-CM | POA: Diagnosis not present

## 2023-12-17 DIAGNOSIS — L03115 Cellulitis of right lower limb: Secondary | ICD-10-CM | POA: Diagnosis not present

## 2023-12-19 DIAGNOSIS — L03115 Cellulitis of right lower limb: Secondary | ICD-10-CM | POA: Diagnosis not present

## 2023-12-23 DIAGNOSIS — L03119 Cellulitis of unspecified part of limb: Secondary | ICD-10-CM | POA: Diagnosis not present

## 2024-01-08 ENCOUNTER — Ambulatory Visit: Payer: Self-pay | Admitting: Family Medicine
